# Patient Record
Sex: Female | Born: 1995 | Race: White | Hispanic: No | Marital: Single | Smoking: Never smoker
Health system: Southern US, Community
[De-identification: ages and names within clinical notes are randomized; demographics above are authoritative.]

## PROBLEM LIST (undated history)

## (undated) DIAGNOSIS — G919 Hydrocephalus, unspecified: Secondary | ICD-10-CM

## (undated) DIAGNOSIS — E236 Other disorders of pituitary gland: Secondary | ICD-10-CM

## (undated) DIAGNOSIS — D352 Benign neoplasm of pituitary gland: Secondary | ICD-10-CM

## (undated) DIAGNOSIS — Q031 Atresia of foramina of Magendie and Luschka: Secondary | ICD-10-CM

## (undated) DIAGNOSIS — Q142 Congenital malformation of optic disc: Secondary | ICD-10-CM

## (undated) DIAGNOSIS — F329 Major depressive disorder, single episode, unspecified: Secondary | ICD-10-CM

## (undated) DIAGNOSIS — F32A Depression, unspecified: Secondary | ICD-10-CM

## (undated) HISTORY — DX: Atresia of foramina of Magendie and Luschka: Q03.1

## (undated) HISTORY — PX: VENTRICULOPERITONEAL SHUNT: SHX204

## (undated) HISTORY — DX: Congenital malformation of optic disc: Q14.2

## (undated) HISTORY — DX: Other disorders of pituitary gland: E23.6

## (undated) HISTORY — DX: Benign neoplasm of pituitary gland: D35.2

---
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
## 2018-08-05 ENCOUNTER — Emergency Department (HOSPITAL_COMMUNITY): Payer: Self-pay

## 2018-08-05 ENCOUNTER — Encounter (HOSPITAL_COMMUNITY): Payer: Self-pay

## 2018-08-05 ENCOUNTER — Other Ambulatory Visit: Payer: Self-pay

## 2018-08-05 ENCOUNTER — Emergency Department (HOSPITAL_COMMUNITY)
Admission: EM | Admit: 2018-08-05 | Discharge: 2018-08-05 | Disposition: A | Discharge status: Home / Self Care | Payer: Self-pay | Attending: Emergency Medicine | Admitting: Emergency Medicine

## 2018-08-05 DIAGNOSIS — R51 Headache: Secondary | ICD-10-CM | POA: Insufficient documentation

## 2018-08-05 DIAGNOSIS — R519 Headache, unspecified: Secondary | ICD-10-CM

## 2018-08-05 DIAGNOSIS — Z79899 Other long term (current) drug therapy: Secondary | ICD-10-CM | POA: Insufficient documentation

## 2018-08-05 LAB — I-STAT BETA HCG BLOOD, ED (MC, WL, AP ONLY): I-stat hCG, quantitative: <5 m[IU]/mL (ref <5)

## 2018-08-05 MED ORDER — PROCHLORPERAZINE EDISYLATE 10 MG/2ML IJ SOLN
10.0000 mg | Freq: Once | INTRAMUSCULAR | Status: AC
  Administered 2018-08-05: 10 mg via INTRAVENOUS

## 2018-08-05 MED ORDER — MAGNESIUM SULFATE 2 GM/50ML IV SOLN
2.0000 g | Freq: Once | INTRAVENOUS | Status: AC
  Administered 2018-08-05: 2 g via INTRAVENOUS
  Filled 2018-08-05: qty 50

## 2018-08-05 MED ORDER — PROCHLORPERAZINE EDISYLATE 10 MG/2ML IJ SOLN
10.0000 mg | Freq: Once | INTRAMUSCULAR | Status: DC
  Administered 2018-08-05: 10 mg via INTRAVENOUS

## 2018-08-05 MED ORDER — IOHEXOL 350 MG/ML SOLN
50.0000 mL | Freq: Once | INTRAVENOUS | Status: AC | PRN
  Administered 2018-08-05: 50 mL via INTRAVENOUS

## 2018-08-05 MED ORDER — PROCHLORPERAZINE EDISYLATE 10 MG/2ML IJ SOLN
10.0000 mg | Freq: Once | INTRAMUSCULAR | Status: DC
  Filled 2018-08-05: qty 2

## 2018-08-05 MED ORDER — DIPHENHYDRAMINE HCL 50 MG/ML IJ SOLN: 25.0000 mg | Freq: Once | INTRAMUSCULAR | Status: DC

## 2018-08-05 MED ORDER — DIPHENHYDRAMINE HCL 50 MG/ML IJ SOLN
25.0000 mg | Freq: Once | INTRAMUSCULAR | Status: DC
  Filled 2018-08-05: qty 1

## 2018-08-05 MED ORDER — MORPHINE SULFATE (PF) 4 MG/ML IV SOLN
4.0000 mg | Freq: Once | INTRAVENOUS | Status: AC
  Administered 2018-08-05: 4 mg via INTRAVENOUS
  Filled 2018-08-05: qty 1

## 2018-08-05 MED ORDER — DIPHENHYDRAMINE HCL 50 MG/ML IJ SOLN
25.0000 mg | Freq: Once | INTRAMUSCULAR | Status: AC
  Administered 2018-08-05: 25 mg via INTRAVENOUS

## 2018-08-05 MED ORDER — SODIUM CHLORIDE 0.9 % IV BOLUS
1000.0000 mL | Freq: Once | INTRAVENOUS | Status: AC
  Administered 2018-08-05: 16:00:00 1000 mL via INTRAVENOUS

## 2018-08-05 MED ORDER — ONDANSETRON HCL 4 MG/2ML IJ SOLN
4.0000 mg | Freq: Once | INTRAMUSCULAR | Status: AC
  Administered 2018-08-05: 15:00:00 4 mg via INTRAVENOUS
  Filled 2018-08-05: qty 2

## 2018-08-05 NOTE — ED Notes (Signed)
Patient verbalizes understanding of discharge instructions . Opportunity for questions and answers were provided . Armband removed by staff ,Pt discharged from ED. W/C  offered at D/C  and Declined W/C at D/C and was escorted to lobby by RN.  

## 2018-08-05 NOTE — ED Triage Notes (Signed)
Onset 11am headache across forehead radiating over top of head and to back of head.  Took ibuprofen x 3, vomited 20 minutes later, x 3.   Pt started Prednisone for widespread rash.  Unknown origin.  Denied fever.  08-06-18 last dose of prednisone.  Pt has h/o VP shunt.

## 2018-08-05 NOTE — ED Notes (Signed)
Patient transported to CT 

## 2018-08-05 NOTE — ED Provider Notes (Signed)
[place] EMERGENCY DEPARTMENT Provider Note   CSN: [id number] Arrival date & time: 08/05/18  1231    History   Chief Complaint Chief Complaint  Patient presents with   Headache    HPI Debra Schultz is a 23 y.o. female.     23 yo F with a cc of headache.  This feels like her prior shunt malfunction.  Last revision was done in 2009.  Going on for a few hours.  Having persistent vomiting.  Denies weakness or numbness to the extremities denies neck pain denies fevers or chills. Having dizziness as well.    The history is provided by the patient.  Headache  Pain location:  Generalized Quality:  Dull Radiates to:  Does not radiate Severity currently:  9/10 Severity at highest:  9/10 Onset quality:  Gradual Duration:  2 hours Timing:  Constant Progression:  Worsening Chronicity:  Recurrent Similar to prior headaches: yes   Relieved by:  Nothing Worsened by:  Nothing Ineffective treatments:  None tried Associated symptoms: nausea and vomiting   Associated symptoms: no congestion, no dizziness, no fever and no myalgias     Past Medical History:  Diagnosis Date   Depression    Hydrocephalus ([place])     There are no active problems to display for this patient.   Past Surgical History:  Procedure Laterality Date   VENTRICULOPERITONEAL SHUNT       OB History   No obstetric history on file.      Home Medications    Prior to Admission medications   Medication Sig Start Date End Date Taking? Authorizing Provider  cephALEXin (KEFLEX) 500 MG capsule Take 1 capsule (500 mg total) by mouth 4 (four) times daily. 09/09/13   Noe Gens, PA-C  citalopram (CELEXA) 20 MG tablet Take 20 mg by mouth daily.    [provider]  ibuprofen (ADVIL,MOTRIN) 200 MG tablet Take 800 mg by mouth every 6 (six) hours as needed.    [provider]  loratadine (CLARITIN) 10 MG tablet Take 1 tablet (10 mg total) by mouth daily. 08/22/13   Isaac Bliss, MD  terbinafine (LAMISIL) 250 MG tablet Take 1 tablet (250 mg total) by mouth daily. For 2-3 weeks. 09/09/13   Noe Gens, PA-C    Family History History reviewed. No pertinent family history.  Social History Social History   Tobacco Use   Smoking status: Never Smoker  Substance Use Topics   Alcohol use: Yes    Comment: rarely    Drug use: Not on file     Allergies   Patient has no known allergies.   Review of Systems Review of Systems  Constitutional: Negative for chills and fever.  HENT: Negative for congestion and rhinorrhea.   Eyes: Negative for redness and visual disturbance.  Respiratory: Negative for shortness of breath and wheezing.   Cardiovascular: Negative for chest pain and palpitations.  Gastrointestinal: Positive for nausea and vomiting.  Genitourinary: Negative for dysuria and urgency.  Musculoskeletal: Negative for arthralgias and myalgias.  Skin: Negative for pallor and wound.  Neurological: Positive for headaches. Negative for dizziness.     Physical Exam Updated Vital Signs BP 107/73    Pulse 90    Temp 97.7 F (36.5 C) (Oral)    Resp 20    SpO2 100%   Physical Exam Vitals signs and nursing note reviewed.  Constitutional:      General: She is not in acute distress.    Appearance:  She is well-developed. She is not diaphoretic.  HENT:     Head: Normocephalic and atraumatic.  Eyes:     Pupils: Pupils are equal, round, and reactive to light.  Neck:     Musculoskeletal: Normal range of motion and neck supple.  Cardiovascular:     Rate and Rhythm: Normal rate and regular rhythm.     Heart sounds: No murmur. No friction rub. No gallop.   Pulmonary:     Effort: Pulmonary effort is normal.     Breath sounds: No wheezing or rales.  Abdominal:     General: There is no distension.     Palpations: Abdomen is soft.     Tenderness: There is no abdominal tenderness.  Musculoskeletal:        General: No tenderness.  Skin:    General:  Skin is warm and dry.  Neurological:     Mental Status: She is alert and oriented to person, place, and time.     Cranial Nerves: Cranial nerves are intact.     Motor: Motor function is intact.     Coordination: Coordination is intact.     Gait: Gait is intact.  Psychiatric:        Behavior: Behavior normal.      ED Treatments / Results  Labs (all labs ordered are listed, but only abnormal results are displayed) Labs Reviewed  I-STAT BETA HCG BLOOD, ED ([facility], [facility], [facility] ONLY)    EKG None  Radiology Ct Angio Head W Or Wo Contrast  Result Date: 08/05/2018 CLINICAL DATA:  Headache. Possible cerebral aneurysm on head CT. History of VP shunt. EXAM: CT ANGIOGRAPHY HEAD AND NECK TECHNIQUE: Multidetector CT imaging of the head and neck was performed using the standard protocol during bolus administration of intravenous contrast. Multiplanar CT image reconstructions and MIPs were obtained to evaluate the vascular anatomy. Carotid stenosis measurements (when applicable) are obtained utilizing NASCET criteria, using the distal internal carotid diameter as the denominator. CONTRAST:  89mL OMNIPAQUE IOHEXOL 350 MG/ML SOLN COMPARISON:  Noncontrast head CT 08/05/2018. No prior angiographic imaging. FINDINGS: CTA NECK FINDINGS Aortic arch: Normal variant aortic arch branching pattern with common origin of the brachiocephalic and left common carotid arteries. Widely patent arch vessel origins. Right carotid system: Patent and smooth without evidence of stenosis or dissection. Left carotid system: Patent and smooth without evidence of stenosis or dissection. Vertebral arteries: Patent and smooth without evidence of stenosis or dissection. Mildly dominant left vertebral artery. Skeleton: No acute osseous abnormality or suspicious osseous lesion. Other neck: No mass or enlarged lymph nodes. VP shunt catheter coursing through the left neck. Calcification adjacent to the catheter may reflect an old catheter tract.  Upper chest: Clear lung apices. Review of the MIP images confirms the above findings CTA HEAD FINDINGS Anterior circulation: The internal carotid arteries are widely patent from skull base to carotid termini. ACAs and MCAs are patent without evidence of proximal branch occlusion or significant proximal stenosis although MCA branch vessel evaluation is mildly limited by venous contamination. No aneurysm is identified. Posterior circulation: Intracranial vertebral arteries are widely patent to the basilar. Patent left PICA and bilateral SCAs are present. AICAs and a right PICA are not well seen. The basilar artery is widely patent. Posterior communicating arteries are diminutive or absent. PCAs are patent without evidence of significant proximal stenosis. No aneurysm is identified. Venous sinuses: As permitted by contrast timing, patent. Anatomic variants: None. Delayed phase: The abnormality described on earlier noncontrast CT represents a 9-10  mm suprasellar mass which appears to extend upwards from the sella and enhances similarly to pituitary. Review of the MIP images confirms the above findings IMPRESSION: 1. Patent intracranial and cervical arterial circulation without major branch occlusion, significant stenosis, or aneurysm. 2. 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 Consider endocrine function tests and correlate for history of pituitary hypersecretion. Follow-up pituitary protocol brain MRI (without and with contrast) can also be considered. Electronically Signed   By: Logan Bores M.D.   On: 08/05/2018 16:02   Ct Head Wo Contrast  Result Date: 08/05/2018 CLINICAL DATA:  23 year old female with acute headache today. History of hydrocephalus and VP shunt. EXAM: CT HEAD WITHOUT CONTRAST TECHNIQUE: Contiguous axial images were obtained from the base of the skull through the vertex without intravenous contrast. COMPARISON:  08/22/2007 CT FINDINGS: Brain: No evidence of acute infarction,  hemorrhage, extra-axial collection or mass effect. A LEFT occipital ventriculostomy catheter is again noted with tip in the MEDIAL aspect of the LEFT LATERAL ventricle. Prominence of the LATERAL ventricle occipital horns is unchanged from 2009. The remainder of the ventricular system is not dilated. Vascular: A 7 mm rounded prominence in the region of the anterior communicating artery noted and aneurysm is not excluded. Skull: No acute abnormality Sinuses/Orbits: No acute abnormality Other: None. IMPRESSION: 1. 7 mm rounded prominence in the region of the anterior communicating artery-aneurysm not excluded. Brain MRI/MRA with contrast recommended. 2. No evidence of acute abnormality. 3. LEFT occipital ventriculostomy catheter. Unchanged prominence of the LATERAL ventricular occipital horns. Electronically Signed   By: Margarette Canada M.D.   On: 08/05/2018 14:02   Ct Angio Neck W And/or Wo Contrast  Result Date: 08/05/2018 CLINICAL DATA:  Headache. Possible cerebral aneurysm on head CT. History of VP shunt. EXAM: CT ANGIOGRAPHY HEAD AND NECK TECHNIQUE: Multidetector CT imaging of the head and neck was performed using the standard protocol during bolus administration of intravenous contrast. Multiplanar CT image reconstructions and MIPs were obtained to evaluate the vascular anatomy. Carotid stenosis measurements (when applicable) are obtained utilizing NASCET criteria, using the distal internal carotid diameter as the denominator. CONTRAST:  70mL OMNIPAQUE IOHEXOL 350 MG/ML SOLN COMPARISON:  Noncontrast head CT 08/05/2018. No prior angiographic imaging. FINDINGS: CTA NECK FINDINGS Aortic arch: Normal variant aortic arch branching pattern with common origin of the brachiocephalic and left common carotid arteries. Widely patent arch vessel origins. Right carotid system: Patent and smooth without evidence of stenosis or dissection. Left carotid system: Patent and smooth without evidence of stenosis or dissection.  Vertebral arteries: Patent and smooth without evidence of stenosis or dissection. Mildly dominant left vertebral artery. Skeleton: No acute osseous abnormality or suspicious osseous lesion. Other neck: No mass or enlarged lymph nodes. VP shunt catheter coursing through the left neck. Calcification adjacent to the catheter may reflect an old catheter tract. Upper chest: Clear lung apices. Review of the MIP images confirms the above findings CTA HEAD FINDINGS Anterior circulation: The internal carotid arteries are widely patent from skull base to carotid termini. ACAs and MCAs are patent without evidence of proximal branch occlusion or significant proximal stenosis although MCA branch vessel evaluation is mildly limited by venous contamination. No aneurysm is identified. Posterior circulation: Intracranial vertebral arteries are widely patent to the basilar. Patent left PICA and bilateral SCAs are present. AICAs and a right PICA are not well seen. The basilar artery is widely patent. Posterior communicating arteries are diminutive or absent. PCAs are patent without evidence of significant proximal stenosis. No aneurysm  is identified. Venous sinuses: As permitted by contrast timing, patent. Anatomic variants: None. Delayed phase: The abnormality described on earlier noncontrast CT represents a 9-10 mm suprasellar mass which appears to extend upwards from the sella and enhances similarly to pituitary. Review of the MIP images confirms the above findings IMPRESSION: 1. Patent intracranial and cervical arterial circulation without major branch occlusion, significant stenosis, or aneurysm. 2. 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 Consider endocrine function tests and correlate for history of pituitary hypersecretion. Follow-up pituitary protocol brain MRI (without and with contrast) can also be considered. Electronically Signed   By: Logan Bores M.D.   On: 08/05/2018 16:02     Procedures Procedures (including critical care time) Procedure note: Ultrasound Guided Peripheral IV Ultrasound guided peripheral 1.88 inch angiocath IV placement performed by me. Indications: Nursing unable to place IV. Details: The antecubital fossa and upper arm were evaluated with a multifrequency linear probe. Patent brachial veins were noted. 1 attempt was made to cannulate a vein under realtime US guidance with successful cannulation of the vein and catheter placement. There is return of non-pulsatile dark red blood. The patient tolerated the procedure well without complications. Images archived electronically.  CPT codes: [id number] and [phone]  Medications Ordered in ED Medications  magnesium sulfate IVPB 2 g 50 mL (2 g Intravenous Incomplete 08/05/18 1558)  sodium chloride 0.9 % bolus 1,000 mL (0 mLs Intravenous Stopped 08/05/18 1632)  diphenhydrAMINE (BENADRYL) injection 25 mg (25 mg Intravenous Given 08/05/18 1323)  prochlorperazine (COMPAZINE) injection 10 mg (10 mg Intravenous Given 08/05/18 1324)  morphine 4 MG/ML injection 4 mg (4 mg Intravenous Given 08/05/18 1451)  ondansetron (ZOFRAN) injection 4 mg (4 mg Intravenous Given 08/05/18 1432)  iohexol (OMNIPAQUE) 350 MG/ML injection 50 mL (50 mLs Intravenous Contrast Given 08/05/18 1525)     Initial Impression / Assessment and Plan / ED Course  I have reviewed the triage vital signs and the nursing notes.  Pertinent labs & imaging results that were available during my care of the patient were reviewed by me and considered in my medical decision making (see chart for details).        23 yo F with a cc of acute headache vertigo and vomiting.  Going on for about 2 hours.  Hx of VP shunt and feels the same. Obtain CT head, give headache cocktail, fluids, shunt series.   Patient is a far is refusing the shunt series.  The CT of the head is similar to her prior that was done here in 09. In discussion with the patient after that time she  was transferred to [facility] and had a revision.  CT also had something that was concerning for possible aneurysm, will obtain a CT angiogram of the head and neck.  CTA of the head and neck pain is without obvious aneurysm, the patient does have possible suprasellar mass.  On reassessment the patient's pain is completely gone.  She is feeling much better.  I discussed the case with Dr. Vertell Limber, neurosurgery with her feeling completely better he felt that she was okay to be discharged.  When I discussed the conversation with her and the results of the CT angiogram she mentioned that they were thinking of going back to [facility] for further care for her VP shunt.  I have given her the information for the neurosurgeon that had seen her most recently.  4:46 PM:  I have discussed the diagnosis/risks/treatment options with the patient and believe the pt to be  eligible for discharge home to follow-up with PCP, . We also discussed returning to the ED immediately if new or worsening sx occur. We discussed the sx which are most concerning (e.g., sudden worsening pain, fever, inability to tolerate by mouth) that necessitate immediate return. Medications administered to the patient during their visit and any new prescriptions provided to the patient are listed below.  Medications given during this visit Medications  magnesium sulfate IVPB 2 g 50 mL (2 g Intravenous Incomplete 08/05/18 1558)  sodium chloride 0.9 % bolus 1,000 mL (0 mLs Intravenous Stopped 08/05/18 1632)  diphenhydrAMINE (BENADRYL) injection 25 mg (25 mg Intravenous Given 08/05/18 1323)  prochlorperazine (COMPAZINE) injection 10 mg (10 mg Intravenous Given 08/05/18 1324)  morphine 4 MG/ML injection 4 mg (4 mg Intravenous Given 08/05/18 1451)  ondansetron (ZOFRAN) injection 4 mg (4 mg Intravenous Given 08/05/18 1432)  iohexol (OMNIPAQUE) 350 MG/ML injection 50 mL (50 mLs Intravenous Contrast Given 08/05/18 1525)     The patient appears reasonably screen and/or  stabilized for discharge and I doubt any other medical condition or other [facility] requiring further screening, evaluation, or treatment in the ED at this time prior to discharge.   Final Clinical Impressions(s) / ED Diagnoses   Final diagnoses:  Bad headache    ED Discharge Orders    None       Deno Etienne, DO 08/05/18 1655

## 2018-08-05 NOTE — Discharge Instructions (Signed)
On CT scan there was concern for a small spot in the sellar space, this needs to be worked up further as an outpatient to identify what it is.  The initial recommendation was for MRI but if your shunt is not compatible and then please discuss with your family doctor and neurosurgeon of different options to try and further work this up.  Please return to the emergency department for worsening headache fever inability to eat or drink.

## 2018-09-14 ENCOUNTER — Encounter (HOSPITAL_COMMUNITY): Payer: Self-pay

## 2018-09-14 ENCOUNTER — Emergency Department (HOSPITAL_COMMUNITY)
Admission: EM | Admit: 2018-09-14 | Discharge: 2018-09-14 | Disposition: A | Discharge status: Home / Self Care | Payer: Medicaid Other | Attending: Emergency Medicine | Admitting: Emergency Medicine

## 2018-09-14 ENCOUNTER — Emergency Department (HOSPITAL_COMMUNITY): Payer: Medicaid Other

## 2018-09-14 ENCOUNTER — Other Ambulatory Visit: Payer: Self-pay

## 2018-09-14 DIAGNOSIS — Z79899 Other long term (current) drug therapy: Secondary | ICD-10-CM | POA: Insufficient documentation

## 2018-09-14 DIAGNOSIS — R000 Tachycardia, unspecified: Secondary | ICD-10-CM

## 2018-09-14 DIAGNOSIS — Z982 Presence of cerebrospinal fluid drainage device: Secondary | ICD-10-CM | POA: Insufficient documentation

## 2018-09-14 LAB — CBC
HCT: 48.3 % — ABNORMAL HIGH (ref 36.0–46.0)
Hemoglobin: 16.3 g/dL — ABNORMAL HIGH (ref 12.0–15.0)
MCH: 31.6 pg (ref 26.0–34.0)
MCHC: 33.7 g/dL (ref 30.0–36.0)
MCV: 93.6 fL (ref 80.0–100.0)
Platelets: 222 10*3/uL (ref 150–400)
RBC: 5.16 MIL/uL — ABNORMAL HIGH (ref 3.87–5.11)
RDW: 13.2 % (ref 11.5–15.5)
WBC: 8.3 10*3/uL (ref 4.0–10.5)
nRBC: 0 % (ref 0.0–0.2)

## 2018-09-14 LAB — BASIC METABOLIC PANEL WITH GFR
Anion gap: 8 (ref 5–15)
BUN: 11 mg/dL (ref 6–20)
CO2: 27 mmol/L (ref 22–32)
Calcium: 9.2 mg/dL (ref 8.9–10.3)
Chloride: 103 mmol/L (ref 98–111)
Creatinine, Ser: 1.15 mg/dL — ABNORMAL HIGH (ref 0.44–1.00)
GFR calc Af Amer: >60 mL/min
GFR calc non Af Amer: >60 mL/min
Glucose, Bld: 115 mg/dL — ABNORMAL HIGH (ref 70–99)
Potassium: 3.9 mmol/L (ref 3.5–5.1)
Sodium: 138 mmol/L (ref 135–145)

## 2018-09-14 LAB — MAGNESIUM: Magnesium: 2.3 mg/dL (ref 1.7–2.4)

## 2018-09-14 LAB — I-STAT BETA HCG BLOOD, ED (MC, WL, AP ONLY): I-stat hCG, quantitative: <5 m[IU]/mL (ref <5)

## 2018-09-14 MED ORDER — SODIUM CHLORIDE 0.9 % IV BOLUS
1000.0000 mL | Freq: Once | INTRAVENOUS | Status: AC
  Administered 2018-09-14: 21:00:00 1000 mL via INTRAVENOUS

## 2018-09-14 MED ORDER — METOPROLOL SUCCINATE ER 25 MG PO TB24: 25.0000 mg | ORAL_TABLET | Freq: Every day | ORAL | 0 refills | Status: DC

## 2018-09-14 NOTE — ED Provider Notes (Signed)
[place] EMERGENCY DEPARTMENT Provider Note   CSN: [id number] Arrival date & time: 09/14/18  1847    History   Chief Complaint Chief Complaint  Patient presents with  . Tachycardia    HPI Debra Schultz is a 23 y.o. female.     HPI Patient presents with tachycardia.  History of VP shunt.  Was seen at [facility] today and had elevated heart rate.  However per the patient has been elevated for most of the last month.  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  Sometimes had some mild shortness of breath but not really short of breath this time.  No swelling in her legs.  No fevers.  No cough.  No abdominal pain.  No chest pain.  No weight loss.  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  Also history of VP shunt.  Patient has had somewhat decreased oral intake. Past Medical History:  Diagnosis Date  . Depression   . Hydrocephalus ([place])     There are no active problems to display for this patient.   Past Surgical History:  Procedure Laterality Date  . VENTRICULOPERITONEAL SHUNT       OB History   No obstetric history on file.      Home Medications    Prior to Admission medications   Medication Sig Start Date End Date Taking? Authorizing Provider  cephALEXin (KEFLEX) 500 MG capsule Take 1 capsule (500 mg total) by mouth 4 (four) times daily. 09/09/13   Noe Gens, PA-C  citalopram (CELEXA) 20 MG tablet Take 20 mg by mouth daily.    [provider]  ibuprofen (ADVIL,MOTRIN) 200 MG tablet Take 800 mg by mouth every 6 (six) hours as needed.    [provider]  loratadine (CLARITIN) 10 MG tablet Take 1 tablet (10 mg total) by mouth daily. 08/22/13   Isaac Bliss, MD  metoprolol succinate (TOPROL-XL) 25 MG 24 hr tablet Take 1 tablet (25 mg total) by mouth daily. 09/14/18   Davonna Belling, MD  terbinafine (LAMISIL) 250 MG tablet Take 1 tablet (250 mg total) by mouth daily. For 2-3  weeks. 09/09/13   Noe Gens, PA-C    Family History History reviewed. No pertinent family history.  Social History Social History   Tobacco Use  . Smoking status: Never Smoker  Substance Use Topics  . Alcohol use: Yes    Comment: rarely   . Drug use: Not on file     Allergies   Patient has no known allergies.   Review of Systems Review of Systems  Constitutional: Negative for appetite change.  HENT: Negative for congestion.   Respiratory: Positive for shortness of breath. Negative for wheezing and stridor.   Cardiovascular: Positive for palpitations. Negative for chest pain.  Gastrointestinal: Negative for abdominal pain.  Genitourinary: Negative for flank pain.  Musculoskeletal: Negative for back pain.  Skin: Negative for rash.  Neurological: Negative for weakness.     Physical Exam Updated Vital Signs BP 111/68   Pulse (!) 110   Temp 99.2 F (37.3 C) (Oral)   Resp 17   Ht 5\' 3"  (1.6 m)   Wt 68 kg   SpO2 100%   BMI 26.57 kg/m   Physical Exam Vitals signs and nursing note reviewed.  HENT:     Head: Normocephalic.  Eyes:     Extraocular Movements: Extraocular movements intact.  Cardiovascular:     Rate and Rhythm:  Tachycardia present.  Pulmonary:     Breath sounds: No wheezing, rhonchi or rales.  Abdominal:     General: There is no distension.  Musculoskeletal:     Right lower leg: No edema.     Left lower leg: No edema.  Skin:    General: Skin is warm.     Capillary Refill: Capillary refill takes less than 2 seconds.  Neurological:     Mental Status: She is alert. Mental status is at baseline.      ED Treatments / Results  Labs (all labs ordered are listed, but only abnormal results are displayed) Labs Reviewed  BASIC METABOLIC PANEL - Abnormal; Notable for the following components:      Result Value   Glucose, Bld 115 (*)    Creatinine, Ser 1.15 (*)    All other components within normal limits  CBC - Abnormal; Notable for the  following components:   RBC 5.16 (*)    Hemoglobin 16.3 (*)    HCT 48.3 (*)    All other components within normal limits  MAGNESIUM  I-STAT BETA HCG BLOOD, ED ([facility], [facility], [facility] ONLY)    EKG EKG Interpretation  Date/Time:  Thursday Sep 14 2018 19:48:57 EDT Ventricular Rate:  137 PR Interval:    QRS Duration: 80 QT Interval:  295 QTC Calculation: 446 R Axis:   75 Text Interpretation:  Sinus tachycardia Borderline T abnormalities, anterior leads No old tracing to compare Confirmed by Davonna Belling [phone]) on 09/14/2018 8:01:09 PM   Radiology Dg Chest Portable 1 View  Result Date: 09/14/2018 CLINICAL DATA:  Shortness of breath EXAM: PORTABLE CHEST 1 VIEW COMPARISON:  09/21/2007 FINDINGS: The heart size and mediastinal contours are within normal limits. Both lungs are clear. The visualized skeletal structures are unremarkable. VP shunt tube overlies the left chest. IMPRESSION: No active disease. Electronically Signed   By: Nelson Chimes M.D.   On: 09/14/2018 20:34    Procedures Procedures (including critical care time)  Medications Ordered in ED Medications  sodium chloride 0.9 % bolus 1,000 mL (0 mLs Intravenous Stopped 09/14/18 2211)     Initial Impression / Assessment and Plan / ED Course  I have reviewed the triage vital signs and the nursing notes.  Pertinent labs & imaging results that were available during my care of the patient were reviewed by me and considered in my medical decision making (see chart for details).        Patient with a sinus tachycardia.  Patient family member states he does go up when she goes to the doctor because she gets a little nervous however reportedly is been running at 120 at home also.  Lab work reassuring.  Although the hemoglobin is a little high.  May have some dehydration component creatinine minimally elevated.  Fluid bolus given a had some decrease in the heart rate.  However I think this is likely not the only component.  Will use  low-dose metoprolol.  Will have cardiology follow-up.  X-ray reassuring.  Pulmonary embolism felt less likely.  Discharge home.  TSH done recently was normal.  Final Clinical Impressions(s) / ED Diagnoses   Final diagnoses:  Sinus tachycardia    ED Discharge Orders         Ordered    metoprolol succinate (TOPROL-XL) 25 MG 24 hr tablet  Daily     09/14/18 2201           Davonna Belling, MD 09/14/18 2316

## 2018-09-14 NOTE — ED Triage Notes (Signed)
Pt brought in by mother. Hx of brain tumor and VP shunt. Per mother she was at [place] earlier today and was discharged but has had persistent tachycardia between 120-144. Pt alert and cooperative on arrival.

## 2018-09-28 ENCOUNTER — Telehealth: Payer: Self-pay | Admitting: Interventional Cardiology

## 2018-09-28 NOTE — Telephone Encounter (Signed)
New Message    Pt is wanting to be seen in the office for her New Patient appt  She also is wondering if she can get a refill of the Metoprolol, enough to get her through until her appt     Please call back

## 2018-09-29 NOTE — Telephone Encounter (Signed)
Called and spoke to patient. She states that she would prefer in office visit if possible but would be willing to do a virtual visit. Made patient aware that I will re-assess covid situation with office protocol for bring patients in a little closer to her appointment and let her know.   Patient is requesting a refill be sent to her pharmacy to last her until her appointment. Patient has never been seen by cardiology. Patient will reach out to PCP for refill.

## 2018-11-08 ENCOUNTER — Encounter: Payer: Self-pay | Admitting: Interventional Cardiology

## 2018-11-08 ENCOUNTER — Ambulatory Visit (INDEPENDENT_AMBULATORY_CARE_PROVIDER_SITE_OTHER): Payer: Self-pay | Admitting: Interventional Cardiology

## 2018-11-08 ENCOUNTER — Other Ambulatory Visit: Payer: Self-pay

## 2018-11-08 VITALS — BP 108/82 | HR 94 | Ht 63.0 in | Wt 154.8 lb

## 2018-11-08 DIAGNOSIS — R000 Tachycardia, unspecified: Secondary | ICD-10-CM

## 2018-11-08 DIAGNOSIS — R002 Palpitations: Secondary | ICD-10-CM

## 2018-11-08 NOTE — Patient Instructions (Signed)
Medication Instructions:  No changes If you need a refill on your cardiac medications before your next appointment, please call your pharmacy.   Lab work: none If you have labs (blood work) drawn today and your tests are completely normal, you will receive your results only by: Marland Kitchen MyChart Message (if you have MyChart) OR . A paper copy in the mail If you have any lab test that is abnormal or we need to change your treatment, we will call you to review the results.  Testing/Procedures: Your physician has requested that you have an echocardiogram. Echocardiography is a painless test that uses sound waves to create images of your heart. It provides your doctor with information about the size and shape of your heart and how well your heart's chambers and valves are working. This procedure takes approximately one hour. There are no restrictions for this procedure.  Follow-Up: Your physician recommends that you schedule a follow-up appointment as needed with Debra Schultz.  Any Other Special Instructions Will Be Listed Below (If Applicable).

## 2018-11-08 NOTE — Progress Notes (Signed)
Cardiology Office Note   Date:  11/08/2018   ID:  Kajuana Shareef, Alferd Apa [birth year], MRN [id number]  PCP:  Marton Redwood, MD    No chief complaint on file.  palpitations  Wt Readings from Last 3 Encounters:  11/08/18 154 lb 12.8 oz (70.2 kg)  09/14/18 150 lb (68 kg)  11/14/13 125 lb 6.4 oz (56.9 kg) (54 %, Z= 0.11)*   * Growth percentiles are based on CDC (Girls, 2-20 Years) data.       History of Present Illness: Debra Schultz is a 23 y.o. female who is being seen today for the evaluation of tachycardia at the request of Marton Redwood, MD.  Medical history includes VP shunt  She has had palpitations on and off for a year.  Metoprolol was started and this has reduced the palpitations.  At the time, she had found out about a pituitary tumor, migraine, vomiting.  ECG in the ER showed sinus tach.  Denies : Chest pain. Dizziness. Leg edema. Nitroglycerin use. Orthopnea. Palpitations. Paroxysmal nocturnal dyspnea. Shortness of breath. Syncope.   Grandfather with pacer.    Overall, sx much less with metoprolol.  She only notices her HR when she is walking quickly.     Past Medical History:  Diagnosis Date  . 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 Congenital anomaly of optic disc   . 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 Depression   . Hydrocephalus ([place])   . Hydrocephalus ([place])   . Intraventricular hemorrhage of newborn   . Pituitary mass [facility])     Past Surgical History:  Procedure Laterality Date  . VENTRICULOPERITONEAL SHUNT       Current Outpatient Medications  Medication Sig Dispense Refill  . clomiPRAMINE (ANAFRANIL) 25 MG capsule Take 25 mg by mouth at bedtime.    . hydrOXYzine (ATARAX/VISTARIL) 25 MG tablet Take 25 mg by mouth 3 (three) times daily as needed.    Marland Kitchen ibuprofen (ADVIL,MOTRIN) 200 MG tablet Take 800 mg by mouth every 6 (six) hours as needed.    . metoprolol succinate (TOPROL-XL) 25 MG 24 hr tablet Take 1 tablet (25 mg total) by  mouth daily. 14 tablet 0   No current facility-administered medications for this visit.     Allergies:   Patient has no known allergies.    Social History:  The patient  reports that she has never smoked. She has never used smokeless tobacco. She reports current alcohol use. She reports that she does not use drugs.   Family History:  The patient's family history includes CAD in her maternal aunt, maternal grandfather, and maternal uncle; Diabetes in her maternal grandfather and maternal grandmother; Other in her father.    ROS:  Please see the history of present illness.   Otherwise, review of systems are positive for tachycardia.   All other systems are reviewed and negative.    PHYSICAL EXAM: VS:  BP 108/82   Pulse 94   Ht 5\' 3"  (1.6 m)   Wt 154 lb 12.8 oz (70.2 kg)   SpO2 98%   BMI 27.42 kg/m  , BMI Body mass index is 27.42 kg/m. GEN: Well nourished, well developed, in no acute distress  HEENT: normal  Neck: no JVD, carotid bruits, or masses Cardiac: RRR; no murmurs, rubs, or gallops,no edema  Respiratory:  clear to auscultation bilaterally, normal work of breathing GI: soft, nontender, nondistended, + BS MS: no deformity or atrophy  Skin: warm and dry, no rash Neuro:  Strength and sensation are intact Psych: euthymic mood, full affect   EKG:   The ekg ordered 09/14/2018 demonstrates sinus tachycardia   Recent Labs: 09/14/2018: BUN 11; Creatinine, Ser 1.15; Hemoglobin 16.3; Magnesium 2.3; Platelets 222; Potassium 3.9; Sodium 138   Lipid Panel No results found for: CHOL, TRIG, HDL, CHOLHDL, VLDL, LDLCALC, LDLDIRECT   Other studies Reviewed: Additional studies/ records that were reviewed today with results demonstrating: .   ASSESSMENT AND PLAN:  1. Tachycardia: Improved with metoprolol.  OK to continue.  Plan for echo to eval why she may have had inappropriate sinus tach.  She did have soe stress at that time due to diagnosis of her pituitary tumor as well.  2.  Increase exercise to target below.      Current medicines are reviewed at length with the patient today.  The patient concerns regarding her medicines were addressed.  The following changes have been made:  No change  Labs/ tests ordered today include:  No orders of the defined types were placed in this encounter.   Recommend 150 minutes/week of aerobic exercise Low fat, low carb, high fiber diet recommended  Disposition:   FU for echo.  If ok, then prn f/u.   Signed, Larae Grooms, MD  11/08/2018 2:10 PM    [facility] [address] [place], [place], [place]  [address] Phone: [phone]; Fax: [phone]

## 2018-11-13 ENCOUNTER — Ambulatory Visit (HOSPITAL_COMMUNITY): Payer: Self-pay | Attending: Cardiology

## 2018-11-13 ENCOUNTER — Other Ambulatory Visit: Payer: Self-pay

## 2018-11-13 DIAGNOSIS — R000 Tachycardia, unspecified: Secondary | ICD-10-CM | POA: Insufficient documentation

## 2020-06-18 ENCOUNTER — Emergency Department (HOSPITAL_COMMUNITY): Payer: Self-pay

## 2020-06-18 ENCOUNTER — Emergency Department (HOSPITAL_COMMUNITY)
Admission: EM | Admit: 2020-06-18 | Discharge: 2020-06-18 | Disposition: A | Discharge status: Home / Self Care | Payer: Self-pay | Attending: Emergency Medicine | Admitting: Emergency Medicine

## 2020-06-18 DIAGNOSIS — R569 Unspecified convulsions: Secondary | ICD-10-CM

## 2020-06-18 DIAGNOSIS — Z86018 Personal history of other benign neoplasm: Secondary | ICD-10-CM | POA: Insufficient documentation

## 2020-06-18 DIAGNOSIS — Z79899 Other long term (current) drug therapy: Secondary | ICD-10-CM | POA: Insufficient documentation

## 2020-06-18 LAB — COMPREHENSIVE METABOLIC PANEL
ALT: 12 U/L (ref 0–44)
AST: 20 U/L (ref 15–41)
Albumin: 3.6 g/dL (ref 3.5–5.0)
Alkaline Phosphatase: 50 U/L (ref 38–126)
Anion gap: 12 (ref 5–15)
BUN: 9 mg/dL (ref 6–20)
CO2: 22 mmol/L (ref 22–32)
Calcium: 8.9 mg/dL (ref 8.9–10.3)
Chloride: 104 mmol/L (ref 98–111)
Creatinine, Ser: 1.05 mg/dL — ABNORMAL HIGH (ref 0.44–1.00)
GFR, Estimated: >60 mL/min (ref >60)
Glucose, Bld: 99 mg/dL (ref 70–99)
Potassium: 4.1 mmol/L (ref 3.5–5.1)
Sodium: 138 mmol/L (ref 135–145)
Total Bilirubin: 0.8 mg/dL (ref 0.3–1.2)
Total Protein: 6.3 g/dL — ABNORMAL LOW (ref 6.5–8.1)

## 2020-06-18 LAB — CBC WITH DIFFERENTIAL/PLATELET
Abs Immature Granulocytes: 0.05 10*3/uL (ref 0.00–0.07)
Basophils Absolute: 0 10*3/uL (ref 0.0–0.1)
Basophils Relative: 0 %
Eosinophils Absolute: 0 10*3/uL (ref 0.0–0.5)
Eosinophils Relative: 0 %
HCT: 44.8 % (ref 36.0–46.0)
Hemoglobin: 14.9 g/dL (ref 12.0–15.0)
Immature Granulocytes: 0 %
Lymphocytes Relative: 6 %
Lymphs Abs: 0.7 10*3/uL (ref 0.7–4.0)
MCH: 31.6 pg (ref 26.0–34.0)
MCHC: 33.3 g/dL (ref 30.0–36.0)
MCV: 94.9 fL (ref 80.0–100.0)
Monocytes Absolute: 0.4 10*3/uL (ref 0.1–1.0)
Monocytes Relative: 3 %
Neutro Abs: 11.3 10*3/uL — ABNORMAL HIGH (ref 1.7–7.7)
Neutrophils Relative %: 91 %
Platelets: 228 10*3/uL (ref 150–400)
RBC: 4.72 MIL/uL (ref 3.87–5.11)
RDW: 12.6 % (ref 11.5–15.5)
WBC: 12.6 10*3/uL — ABNORMAL HIGH (ref 4.0–10.5)
nRBC: 0 % (ref 0.0–0.2)

## 2020-06-18 LAB — ETHANOL: Alcohol, Ethyl (B): <10 mg/dL (ref <10)

## 2020-06-18 LAB — URINALYSIS, ROUTINE W REFLEX MICROSCOPIC
Bilirubin Urine: NEGATIVE
Glucose, UA: NEGATIVE mg/dL
Hgb urine dipstick: NEGATIVE
Ketones, ur: NEGATIVE mg/dL
Nitrite: NEGATIVE
Protein, ur: NEGATIVE mg/dL
Specific Gravity, Urine: 1.008 (ref 1.005–1.030)
pH: 6 (ref 5.0–8.0)

## 2020-06-18 LAB — RAPID URINE DRUG SCREEN, HOSP PERFORMED
Amphetamines: NOT DETECTED
Barbiturates: NOT DETECTED
Benzodiazepines: NOT DETECTED
Cocaine: NOT DETECTED
Opiates: NOT DETECTED
Tetrahydrocannabinol: NOT DETECTED

## 2020-06-18 LAB — MAGNESIUM: Magnesium: 2.3 mg/dL (ref 1.7–2.4)

## 2020-06-18 LAB — TSH: TSH: 2.249 u[IU]/mL (ref 0.350–4.500)

## 2020-06-18 MED ORDER — LACTATED RINGERS IV SOLN: INTRAVENOUS | Status: DC

## 2020-06-18 NOTE — ED Triage Notes (Signed)
Per ems pts family states that they heard a thud in the bathroom and when they went to see what happened the bathroom door was vibrating/shaking vigorously ~ a few minutes. Pt has a pituitary tumor and a shunt placed. Pt was alert and oriented x2 with ems but is now alert and oriented x4. Pt doesn't have a seizure history. With ems pt hr is 160 and per family 140-160 is her baseline, patient has seen a cardiologist for it.

## 2020-06-18 NOTE — ED Notes (Signed)
Patient transported to CT 

## 2020-06-18 NOTE — Discharge Instructions (Addendum)
1.  Call your neurologist at [facility] to schedule follow-up as soon as possible.  You will need an outpatient EEG.  You can discuss the possibility of a repeat MRI with your neurologist. 2.  Decrease your imipramine dose to 100 mg daily.  Call your psychiatrist as soon as possible to discuss medication changes. 3.  No activities that could result in injury if you had a seizure.  Do not climb to high places, swim for drive. 4.  Return to the emergency department if you develop any concerning, new symptoms or a repeat seizure. 5.  No alcohol or other drugs of abuse.  Do not take any other over-the-counter medications.  Try to get regular sleep at least 8 hours a day.  Try to eat regularly scheduled meals and small, nutritious snacks.

## 2020-06-18 NOTE — ED Provider Notes (Signed)
[facility] EMERGENCY DEPARTMENT Provider Note   CSN: [id number] Arrival date & time: 06/18/20  1720     History Chief Complaint  Patient presents with  . Seizures    Debra Schultz is a 25 y.o. female.  HPI Patient has no history of seizure disorder.  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  She has been treated with antidepressants for much of her life.  She reports she is on imipramine 100 mg twice a day.  This was a dose increased from 100 mg daily about 3 months ago.  She also takes Abilify 25 mg daily.  This was a new medication as of 3 to 4 weeks ago.  She reports she sometimes takes hydroxyzine but has not taken any for a number of months.  Denies any drugs of abuse.  Patient lives at home and goes to college locally.  She reports she felt well today.  She is not having headaches or visual symptoms.  No recent nausea vomiting or diarrhea.  She went into the bathroom to take a shower.  She reports she really got no preceding symptoms.  She turned her head and was getting in the shower and that the last thing she remembers.  Her mother heard her hit the ground in the bathroom.  She went to check on her and could hear his repetitive thumping on the door that sounded like a seizure.  The door was locked.  Her husband was able to break the door.  She reports they found the patient actively seizing with blue lips.  She was able to turn her on her side and the seizure abated.  Once she was awake she returned to normal mental status.  Patient reports that she does not have any headache, visual changes, chest pain or any other symptoms after the seizure.  She reports she feels back to normal.    Past Medical History:  Diagnosis Date  . 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 Congenital anomaly of optic disc   . 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 Depression   . Hydrocephalus ([place])   . Hydrocephalus ([place])   .  Intraventricular hemorrhage of newborn   . Pituitary mass ([place])     There are no problems to display for this patient.   Past Surgical History:  Procedure Laterality Date  . VENTRICULOPERITONEAL SHUNT       OB History   No obstetric history on file.     Family History  Problem Relation Age of Onset  . Other Father        SMOKER  . Diabetes Maternal Grandmother   . CAD Maternal Grandfather   . Diabetes Maternal Grandfather   . CAD Maternal Aunt   . CAD Maternal Uncle     Social History   Tobacco Use  . Smoking status: Never Smoker  . Smokeless tobacco: Never Used  Substance Use Topics  . Alcohol use: Yes    Comment: rarely   . Drug use: Never    Home Medications Prior to Admission medications   Medication Sig Start Date End Date Taking? Authorizing Provider  ARIPiprazole (ABILIFY) 5 MG tablet Take 5 mg by mouth at bedtime.   Yes [provider]  clomiPRAMINE (ANAFRANIL) 50 MG capsule Take 100 mg by mouth 2 (two) times daily.   Yes [provider]  hydrOXYzine (ATARAX/VISTARIL) 25 MG tablet Take 25 mg by mouth as  needed for anxiety.   Yes [provider]  levonorgestrel (MIRENA, 52 MG,) 20 MCG/24HR IUD 1 each by Intrauterine route once. Mirena 20 mcg/24 hours (7 yrs) 52 mg intrauterine device.   Yes [provider]  metoprolol succinate (TOPROL-XL) 25 MG 24 hr tablet Take 1 tablet (25 mg total) by mouth daily. Patient not taking: No sig reported 09/14/18   Davonna Belling, MD    Allergies    Patient has no known allergies.  Review of Systems   Review of Systems 10 systems reviewed and negative except as per HPI Physical Exam Updated Vital Signs BP 94/68   Pulse (!) 129   Temp 98.7 F (37.1 C) (Oral)   Resp (!) 21   SpO2 99%   Physical Exam Constitutional:      Appearance: Normal appearance. She is well-developed and well-nourished.  HENT:     Head: Normocephalic and atraumatic.     Mouth/Throat:     Mouth:  Mucous membranes are moist.     Pharynx: Oropharynx is clear.  Eyes:     Extraocular Movements: Extraocular movements intact and EOM normal.     Pupils: Pupils are equal, round, and reactive to light.  Cardiovascular:     Rate and Rhythm: Normal rate and regular rhythm.     Pulses: Intact distal pulses.     Heart sounds: Normal heart sounds.  Pulmonary:     Effort: Pulmonary effort is normal.     Breath sounds: Normal breath sounds.  Abdominal:     General: Bowel sounds are normal. There is no distension.     Palpations: Abdomen is soft.     Tenderness: There is no abdominal tenderness.  Musculoskeletal:        General: No edema. Normal range of motion.     Cervical back: Neck supple.  Skin:    General: Skin is warm, dry and intact.  Neurological:     General: No focal deficit present.     Mental Status: She is alert and oriented to person, place, and time.     GCS: GCS eye subscore is 4. GCS verbal subscore is 5. GCS motor subscore is 6.     Cranial Nerves: No cranial nerve deficit.     Motor: No weakness.     Coordination: Coordination normal.     Deep Tendon Reflexes: Strength normal.  Psychiatric:        Mood and Affect: Mood and affect and mood normal.     ED Results / Procedures / Treatments   Labs (all labs ordered are listed, but only abnormal results are displayed) Labs Reviewed  COMPREHENSIVE METABOLIC PANEL - Abnormal; Notable for the following components:      Result Value   Creatinine, Ser 1.05 (*)    Total Protein 6.3 (*)    All other components within normal limits  CBC WITH DIFFERENTIAL/PLATELET - Abnormal; Notable for the following components:   WBC 12.6 (*)    Neutro Abs 11.3 (*)    All other components within normal limits  URINALYSIS, ROUTINE W REFLEX MICROSCOPIC - Abnormal; Notable for the following components:   Color, Urine STRAW (*)    Leukocytes,Ua SMALL (*)    Bacteria, UA RARE (*)    All other components within normal limits  ETHANOL   RAPID URINE DRUG SCREEN, HOSP PERFORMED  TSH  MAGNESIUM  I-STAT BETA HCG BLOOD, ED ([facility], [facility], [facility] ONLY)    EKG EKG Interpretation  Date/Time:  Wednesday June 18 2020  17:37:12 EST Ventricular Rate:  144 PR Interval:    QRS Duration: 76 QT Interval:  290 QTC Calculation: 449 R Axis:   113 Text Interpretation: Sinus or ectopic atrial tachycardia Right axis deviation Nonspecific T abnormalities, diffuse leads no sig change from previous Confirmed by Charlesetta Shanks [phone]) on 06/18/2020 5:43:20 PM   Radiology CT Head Wo Contrast  Result Date: 06/18/2020 CLINICAL DATA:  Seizure EXAM: CT HEAD WITHOUT CONTRAST TECHNIQUE: Contiguous axial images were obtained from the base of the skull through the vertex without intravenous contrast. COMPARISON:  CT brain 08/05/2018 FINDINGS: Brain: No acute territorial infarction or intracranial hemorrhage is visualized. 7 mm soft tissue density at the suprasellar region. Left posterior shunt catheter with tip terminating in the left lateral ventricle. Stable ventricle size with asymmetric enlargement of the posterior horns. No midline shift. Vascular: No hyperdense vessels.  No unexpected calcification Skull: Left posterior parietal burr hole Sinuses/Orbits: No acute finding. Other: None IMPRESSION: 1. No definite CT evidence for acute intracranial abnormality. 2. 7 mm soft tissue density at the suprasellar region without change, this may be pituitary in origin. See CT angiography 08/05/2018. 3. Stable ventricle size with left posterior shunt catheter in place. Electronically Signed   By: Donavan Foil M.D.   On: 06/18/2020 19:42    Procedures Procedures   Medications Ordered in ED Medications  lactated ringers infusion ( Intravenous New Bag/Given 06/18/20 2031)    ED Course  I have reviewed the triage vital signs and the nursing notes.  Pertinent labs & imaging results that were available during my care of the patient were reviewed by me and considered in  my medical decision making (see chart for details).    MDM Rules/Calculators/A&P                          Consult: Reviewed with neurology Dr. Theda Sers.  At this time, consideration given to decrease seizure threshold with imipramine.  Although notably he advises typically this is seen at doses at 300 mg or greater.  Advised patient could get EEG and MRI but also reasonable to do this on outpatient basis with follow-up.  Patient presents with a new onset seizure.  This was witnessed at home.  Patient has history of pituitary adenoma which is stable on CT.  She has VP shunt which has been longstanding.  Patient's mother reports that she gets routine evaluation at [facility].  She did have an MRI done July 2021 which showed no change in the adenoma.  Patient shows no signs of any recent illness.  She has not been experiencing headaches, confusion, imbalance.  She is asymptomatic after the event.  At this time plan will be to follow-up with her providers at [facility] and determine whether repeat MRI imaging is indicated.  Patient needs to get a follow-up EEG.  Seizure precaution advised.  Patient will taper her imipramine down to 100 mg a day.  Patient's mother does not want her on antidepressants any longer.  She suspects her daughter has been misdiagnosed with depression when she actually has a mild form of autism.  She reports her daughter has been treated with antidepressants for many years and they have never seemed helpful.  I advised is very important to follow-up with the patient's psychologist and psychiatrist regarding definitive management of her medications, but agree with decreasing the imipramine with close follow-up plan. Final Clinical Impression(s) / ED Diagnoses Final diagnoses:  New onset seizure ([place])  Rx / DC Orders ED Discharge Orders    None       Charlesetta Shanks, MD 06/18/20 2302

## 2020-06-18 NOTE — ED Notes (Signed)
Lined bed rails with blankets and a seizure pad.

## 2020-07-22 DIAGNOSIS — R87619 Unspecified abnormal cytological findings in specimens from cervix uteri: Secondary | ICD-10-CM | POA: Insufficient documentation

## 2021-03-11 IMAGING — CT CT HEAD W/O CM
4 series · 17 of 47 positions shown, 19 images · non-contrast
Comparison: CT brain 08/05/2018

CLINICAL DATA: Seizure

EXAM:
CT HEAD WITHOUT CONTRAST
TECHNIQUE: Contiguous axial images were obtained from the base of the skull
through the vertex without intravenous contrast.

[Series 3: head bone · axial · 0.40mm/px · z∈[-109,-55]mm · 4 of 77 slices shown]
[im 8/77  bone]
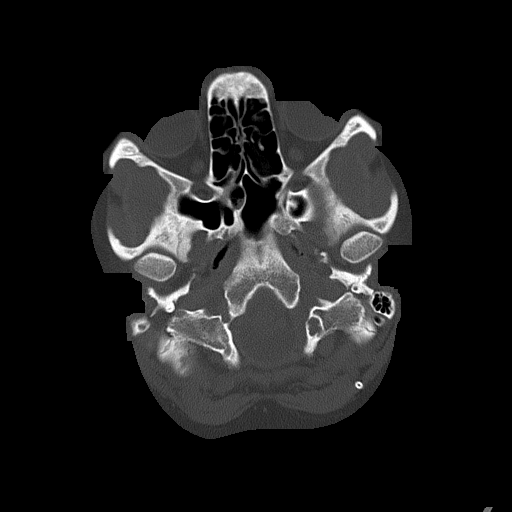
[im 16/77  bone]
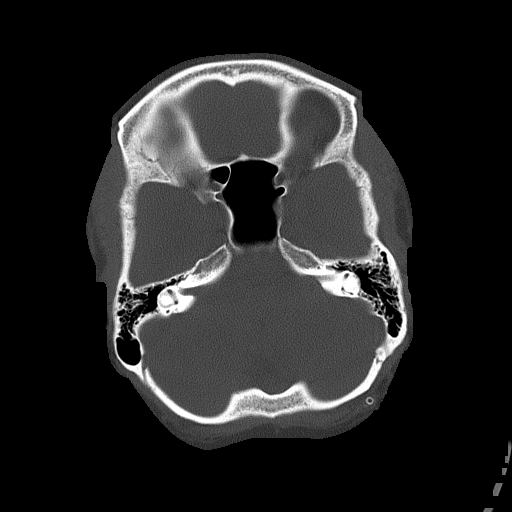
[im 23/77  bone]
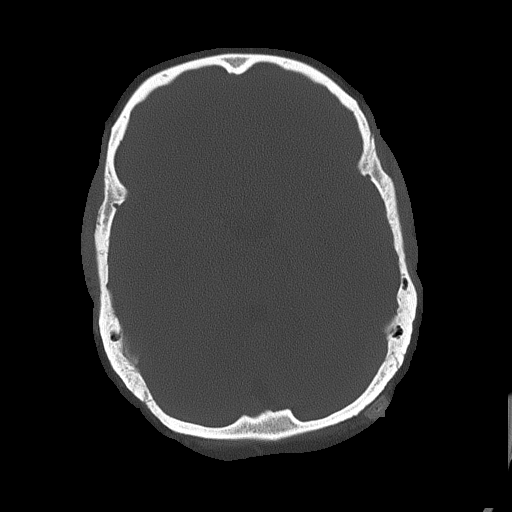
[im 35/77  bone]
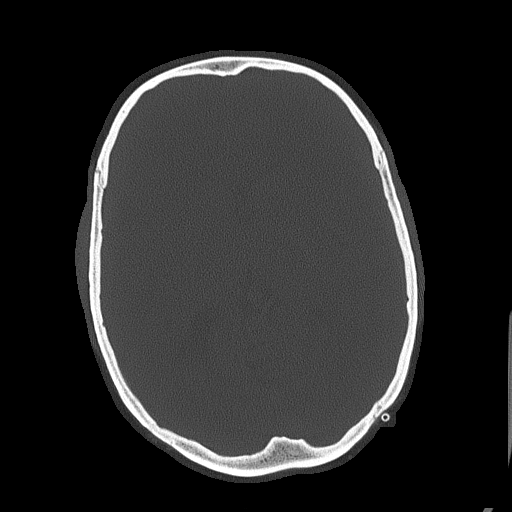

[Series 4: head without · axial · non-contrast · 0.40mm/px · z∈[-108,+7]mm · 7 of 31 slices shown, 9 images]
[im 4/31  brain]
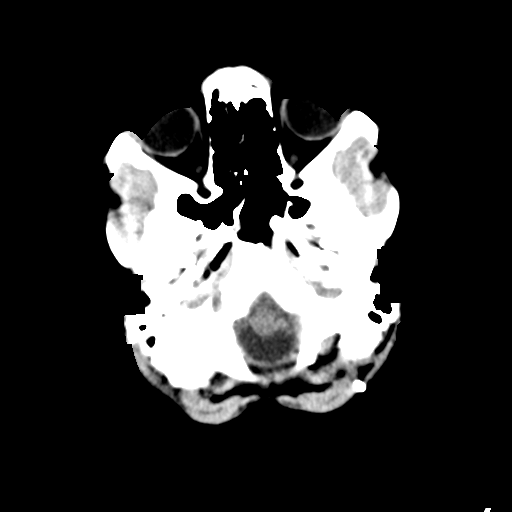
[im 4/31  bone]
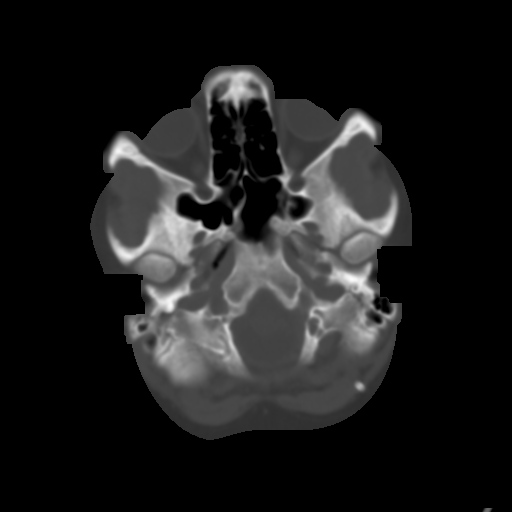
[im 8/31  brain]
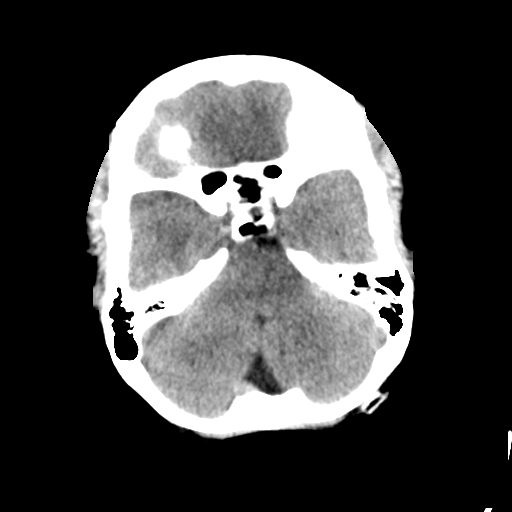
[im 12/31  brain]
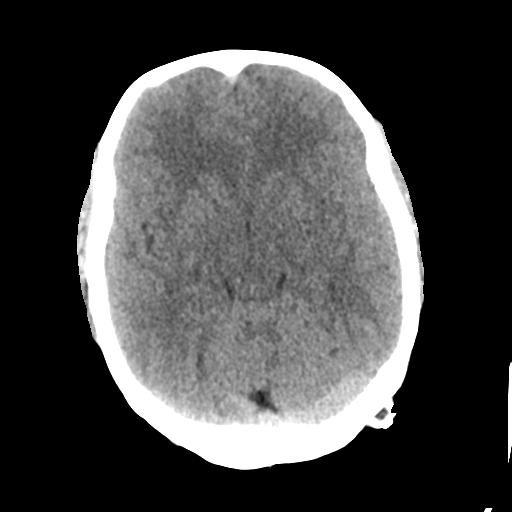
[im 16/31  brain]
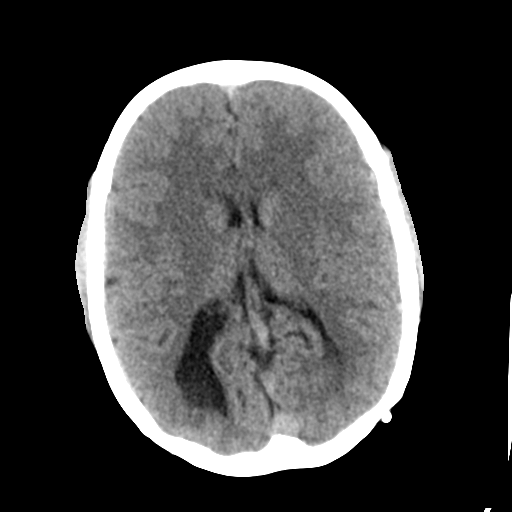
[im 19/31  brain]
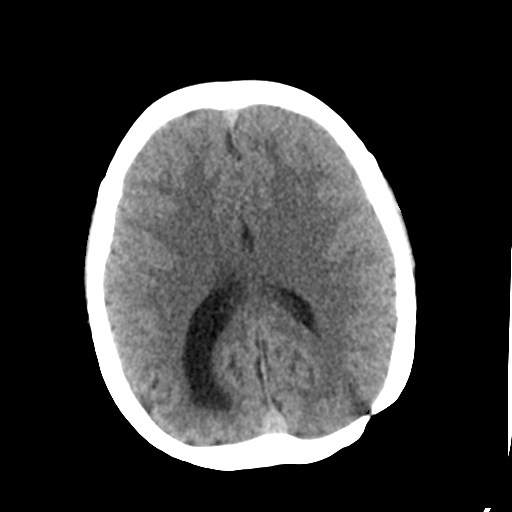
[im 19/31  bone]
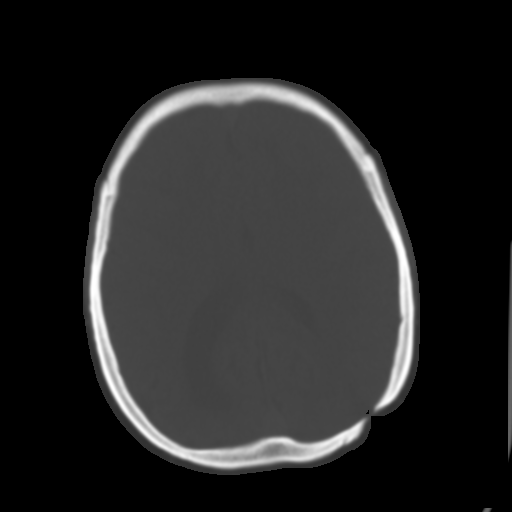
[im 23/31  brain]
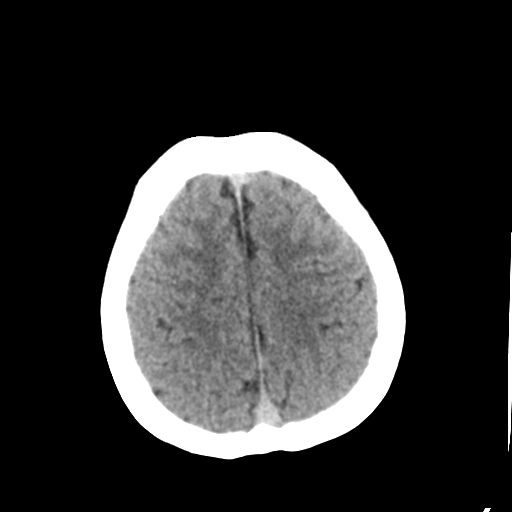
[im 27/31  brain]
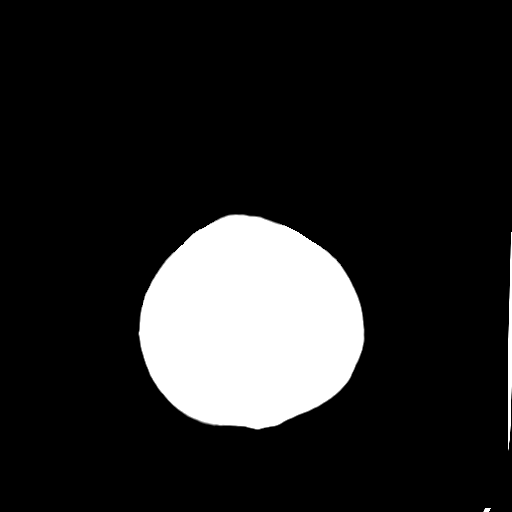

[Series 5: head without cor · coronal · non-contrast · 0.30mm/px · 3 of 65 slices shown]
[im 22/65  brain]
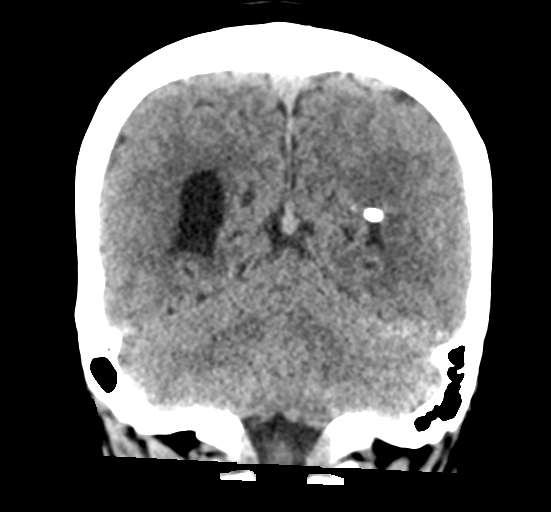
[im 29/65  brain]
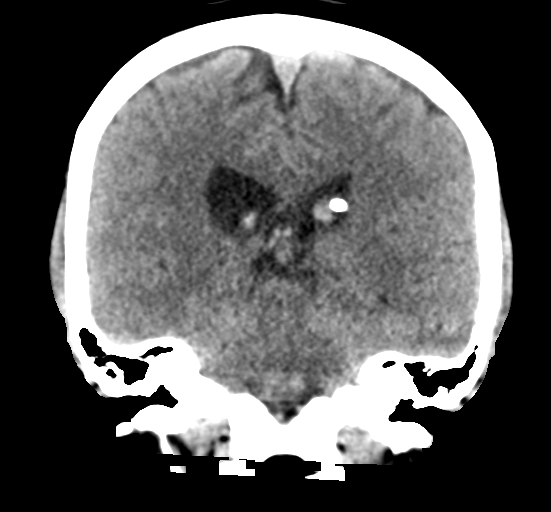
[im 36/65  brain]
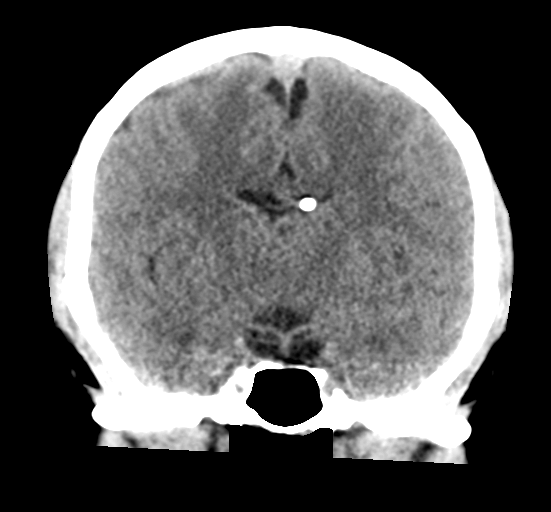

[Series 6: head without sag · sagittal · non-contrast · 0.33mm/px · 3 of 67 slices shown]
[im 23/67  brain]
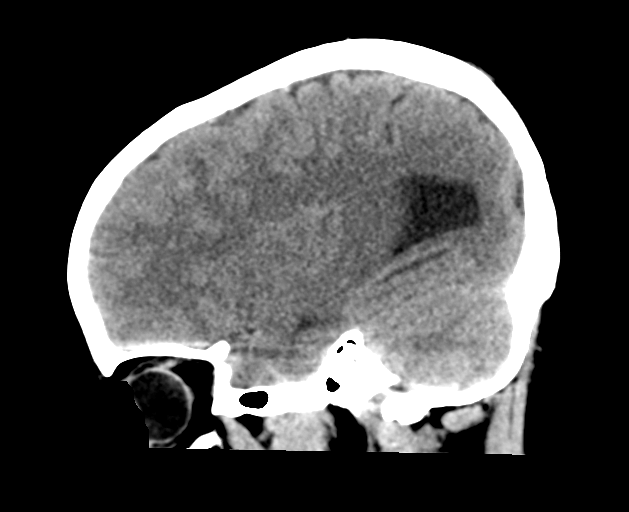
[im 34/67  brain]
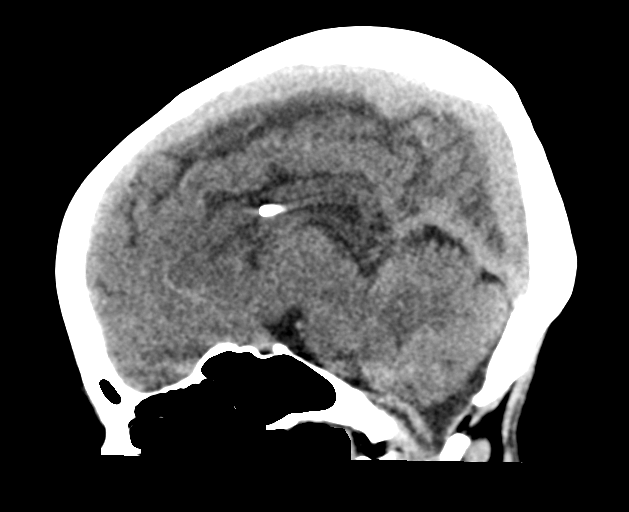
[im 45/67  brain]
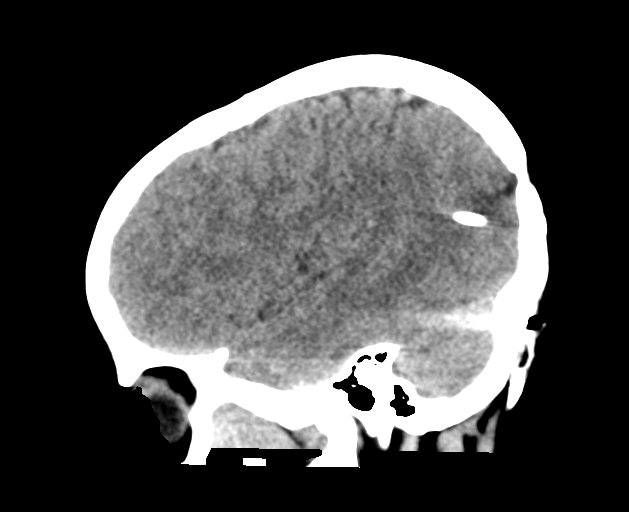

[17 of 47 positions shown; findings below may reference images not displayed]

FINDINGS: Brain: No acute territorial infarction or intracranial hemorrhage is
visualized. 7 mm soft tissue density at the suprasellar region. Left
posterior shunt catheter with tip terminating in the left lateral
ventricle. Stable ventricle size with asymmetric enlargement of the
posterior horns. No midline shift.

Vascular: No hyperdense vessels.  No unexpected calcification

Skull: Left posterior parietal burr hole

Sinuses/Orbits: No acute finding.

Other: None
IMPRESSION: 1. No definite CT evidence for acute intracranial abnormality.
2. 7 mm soft tissue density at the suprasellar region without
change, this may be pituitary in origin. See CT angiography
08/05/2018.
3. Stable ventricle size with left posterior shunt catheter in
place.

## 2021-06-30 ENCOUNTER — Ambulatory Visit (INDEPENDENT_AMBULATORY_CARE_PROVIDER_SITE_OTHER): Payer: No Typology Code available for payment source | Admitting: Clinical

## 2021-06-30 DIAGNOSIS — F419 Anxiety disorder, unspecified: Secondary | ICD-10-CM
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
## 2021-07-13 ENCOUNTER — Other Ambulatory Visit: Payer: Self-pay

## 2021-07-13 ENCOUNTER — Ambulatory Visit (INDEPENDENT_AMBULATORY_CARE_PROVIDER_SITE_OTHER): Payer: No Typology Code available for payment source | Admitting: Clinical

## 2021-07-13 DIAGNOSIS — F411 Generalized anxiety disorder: Secondary | ICD-10-CM

## 2021-07-13 DIAGNOSIS — F331 Major depressive disorder, recurrent, moderate: Secondary | ICD-10-CM
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
## 2021-10-01 ENCOUNTER — Ambulatory Visit (INDEPENDENT_AMBULATORY_CARE_PROVIDER_SITE_OTHER): Payer: No Typology Code available for payment source | Admitting: Clinical

## 2021-10-01 DIAGNOSIS — F331 Major depressive disorder, recurrent, moderate: Secondary | ICD-10-CM | POA: Diagnosis not present

## 2021-10-01 DIAGNOSIS — F4010 Social phobia, unspecified: Secondary | ICD-10-CM

## 2021-10-01 DIAGNOSIS — F411 Generalized anxiety disorder: Secondary | ICD-10-CM

## 2021-10-01 DIAGNOSIS — F8082 Social pragmatic communication disorder: Secondary | ICD-10-CM

## 2021-10-01 NOTE — Progress Notes (Signed)
Testing Visit Documentation    Name: Debra Schultz     MRN: [id number]  Date of Birth: 06/29/1995                                        Age: 26 y.o.  Date of Visit 10/01/21    Type of Service Provided Psychological Testing (Feedback session) Type of Contact: in-person  Location: office Those present at Session: Debra Schultz and her mother  Visit Information: Debra Schultz and her parent presented for the results of the evaluation. No substantial new concerns were reported since last visit, though she has noted an increase in checking of the door and indicated that in the last 5 or so years she has felt that some of her skills have declined.  Results of the assessment were reviewed and interpreted for Debra Schultz including information that supported a diagnosis of GAD, social anxiety, MDD, social communication disorder, and provisional other specified AD/HD. She does not meet full diagnostic criteria for ASD. Additional neuropsychological testing to rule out residual concerns from her medical conditions and brain surgeries was discussed. Recommendations were provided. A full written report will be completed and shared with Debra Schultz. Please see the completed report for more detailed information regarding background information, testing results and interpretation.   Plan: Evaluation complete - appropriate referrals and recommendations for next steps made.    Time Spent as part of the current visit:  Additional integration/Report Generation: 07/14/2021, 12:30 pm -12:50pm, 07/27/2021, 3:00pm-3:30pm, 09/14/2021, 9:45 pm - 10:25 pm, 09/19/2021, 4:50 pm - 5:15 pm, 09/25/2021, 11:20 am - 11:45 am, 09/30/2021, 9:45 pm - 10:30 pm (185 minutes)  Time spent in Interactive Feedback Session: 10/01/2021, 3:15 pm - 4:50 pm (95 minutes)   Please see the notes from dates of services 07/13/2021 for additional documentation of times spent and units that are to be billed  Total billing (including from the current session and  prior dates of service listed above) is as follows:  Total spent in Test Administration and Scoring across all testing visits: 310 minutes  Units billed: 96136 = 1 unit  96137 = 9 units  Total time spend in Testing Evaluation Services including, but not limited to, the integrative feedback session and integration/report generation: 435 minutes   Units billed: 96130 = 1 unit 96131 = 5 units       Zara Chess, PhD

## 2021-10-15 NOTE — Progress Notes (Signed)
____________________________________________________________________________________   CONFIDENTIAL PSYCHOLOGICAL ASSESSMENT1The assessment results are confidential.  This report is not to be copied in whole, or in part, nor discussed without the consent of the parent/guardian or the individual (if 18 years or older).  As children grow and mature, after several years some of the assessment results may become less valid, at which time they are best regarded as useful background information. Name: Debra Schultz      MRN: [id number]  Date of Birth: 1995-11-01      Age: 26 years  Dates of Evaluation: 06/30/2021, 07/13/2021, 10/01/2021  Date of Report:  10/11/2021 Psychologist:  Zara Chess, PhD     Psychology License # [id number], Health Services Provider Certification: HSP-P  Reason for Evaluation Debra Schultz was seen for an evaluation at [facility] due to concerns about her attention and focus, anxiety, social skills, and some repetitive behaviors. Debra Schultz reported that she has worked with mental health professionals since she was 26 years old, but nothing seems to have worked for her. She has always felt that she was a bit different from others and has shown some repetitive behaviors. She was hoping that further evaluation would help to clarify her diagnostic profile and provide information about potential supports.  Relevant Background Information The following background information was obtained from an interview completed with Debra Schultz's mother Leafy Kindle), interviews with Debra Schultz, information gathered from a Developmental History Form, and a review of some available records. The accuracy of the background information is contingent upon the reliability of the responses provided as well as the validity of the information contained in previous records.  Pregnancy and Birth Information Medication during pregnancy: Magnesium IV                                              Exposure to substances or potentially harmful events in utero: No  Complications during pregnancy/delivery: Pregnancy was complicated by high blood pressure, weight gain of more than 30 pounds, preeclampsia, and frequent nausea or vomiting. Her mother was given a magnesium IV for seizure prevention and to try to extend pregnancy. Labor was induced at 32 weeks.  Length of pregnancy: 32 weeks  Delivery method: Induced vaginal        Birth weight: 2 lbs. 15 oz   Complications post-delivery: After birth Debra Schultz developed trouble breathing and was put on oxygen. 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 She was in the NICU for a month and then the regular hospital for another month. While in the NICU she also developed jaundice that was treated with phototherapy.  Developmental Milestones Age of first developmental/behavioral concern: Debra Schultz has "always felt different". For example, for as long as she can remember she has always rocked back and forth. Socially, she seemed to have an easier time in elementary school than she did later in childhood because others did not notice her differences as much. By middle school she struggled to connect with others and became very reserved. She started becoming more depressed and isolated in the 6th grade and was frequently teased for being quiet.   According to her mother, in infancy Debra Schultz was difficult to feed, was not very sociable, and she showed motor skill delays. During her first year of life Debra Schultz was on 7 different medications to try to drain her hydrocephalus. However, these medications did not remedy the problem and at the  age of 26 Debra Schultz had a shunt placed. Afterwards she had in home therapy to help her develop her motor skills. She was about [age] years old before she could sit up independently or crawl. She started walking around the age of [age]. Despite these early difficulties, her mother described Debra Schultz as  very open and outgoing, and she had a number of friends. Bennett's mother reported that Debra Schultz's behavior significantly changed around the 5th or 6th grade, which is also around the time that she had her shunt replaced. More specifically, around the age of 39 Debra Schultz started to shut down and wanted to be secluded and by herself more frequently, and her overall social skills and communication appeared diminished at this time. For example, during this time period, Debra Schultz appeared much less outgoing and stopped talking to people (though she would respond when someone talked to her).   As she has aged her parent has also noticed that it takes Debra Schultz longer than it takes others to process and learn information. Despite seeming to be very "book smart" and having good grades in school, Debra Schultz seems to have difficulty with day-to-day tasks like paying bills and putting away laundry. She also struggles with more complex activities such as driving. For example, although Debra Schultz took driving lessons and her parents have persisted in working with her on driving, Debra Schultz continues to struggle with simultaneously tracking all necessary information when driving (e.g., she could follow a direction such as go to the corner and make a right, but may not pay attention to things like the color of the light or remember to check for traffic before turning). Her mother noted that a few family members in the medical field have approached her to suggest that Debra Schultz shows some signs of autism spectrum disorder. Currently Debra Schultz struggles with communicating and showing emotions. She will respond to people that talk to her but does not approach others, and is bothered by being out in public around people. Her mother noted that Debra Schultz's change in behavior seemed to be somewhat gradual. During the results meeting Debra Schultz described some of her skills as further "deteriorating" over the last 5 or so years.   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 Debra Schultz had a seizure about a year ago. She has a high heart rate. According to her records, Darnisha's medical history also includes an intraventricular hemorrhage as newborn.   Significant accidents, hospitalizations, surgeries, or infections: Debra Schultz had her first shunt surgery in 1998 and with a replacement surgery in 2009, when she was around 63 years old (her mother reported that she required two surgeries at this time because her new shunt broke within a few days, and she had to get another one). She had hernia surgery when she was [age] years old. Debra Schultz also had a seizure last year. Her mother had concerns that the seizure could have been related to the medications that Debra Schultz was taking at the time, so her mother helped Debra Schultz to stop taking her medications.  Allergies:  Debra Schultz is allergic to some antidepressants (she reportedly gets a rash)                                                                                                                       Currently taking any medication: No                                                                                  Current/past eating/feeding concerns:  Debra Schultz reported that she has a tendency overeat. She has never been diagnosed with an eating disorder but is anxious about how much she has been eating recently. She started a 1200 calorie diet and now worries about going over her target calories. Debra Schultz will not eat coconut because she does not like it.                                                                                     Current/past sleeping concerns:  Debra Schultz reported that she has "very bad insomnia" because her brain is always "on 100". She has trouble falling asleep and will wake up in the middle of the night and then cannot go back to sleep.                                                                                    Hygiene concerns/changes: Debra Schultz has always had some hygiene challenges, but in high school she had an easier time managing hygiene activities than she does now. She started focusing less on her hygiene after transitioning into college. Currently, for example, Debra Schultz rarely has the energy to take a shower. There was a period when did not want to cut hair. Her mother noted that Debra Schultz used to take a shower every day but currently takes one every two to three days. Her mother noted that these challenges could possibly be related to Amarah's depression and/or anxiety.    Psychiatric History Current/past exposure to traumas and/or significant stressors (e.g., abuse, witness to violence, fires, significant car accidents):  Debra Schultz reported some exposure to stressors or potential traumas.   Current/past aggressive behavior:  Debra Schultz reported that she can be a little "snarky" at times but is not aggressive.                                                                                 Current/past significant behavioral concerns (e.g., stealing, fire setting, annoying other on purpose or easily annoyed by others): No              Current/past hearing/seeing things not there or expressing unusual beliefs/ideas: No                                                                                         Current/past mood concerns (depressed or unusually elevated moods): Debra Schultz was reportedly diagnosed with depression when she was 38- or 26 years old, with her symptoms of depression reportedly increasing overtime. 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 Recently, Debra Schultz has been feeling disconnected and numb, like the "days are just passing by."  Additional symptoms of depression reported by Debra Schultz include regular feelings of sadness or down moods, feelings of hopelessness, worthlessness, or guilt, decreased energy, loss of interest in previously pleasurable activities, difficulty concentrating and making decisions, irritability, and sleep changes. Debra Schultz described feeling that she is not where she should be for 25 (i.e., she feels that everyone else her age seems to be able to do things that she cannot) and noted that social media can impact the amount of crying that she experiences during the day. Debra Schultz also has some negative thoughts, though denied experiencing suicidal ideation. Over time, Debra Schultz has had periods when she experienced decreased symptoms of depression, but symptoms have always returned. Debra Schultz reported that she may experience an elevated mood for a few hours at a time, but this does not occur very often and does not last for very long.                                           Current/past anxiety concerns (separation, social, general):  Debra Schultz reported that she was previously diagnosed with generalized anxiety disorder. She continues to experience anxiety about a number of different occurrences. Generally, Debra Schultz described herself as in a "constant state of anxiety" and described her worries as excessive. For example, she is always looking over her shoulder when in a big crowd, and because of "how the world is" whenever she is in public she is "always looking for an exit" and planning how she would get of the situation she is in. She indicated that she also has "way too much anxiety to drive." Debra Schultz has difficulty controlling her anxiety once it starts. When  anxious Debra Schultz will feel restless or on edge, has difficulty concentrating, feels that her mind goes blank, is irritable, experiences  muscle tension, and has difficulty sleeping. In addition, her mother has observed that Debra Schultz may begin to panic if her mother is not home at the expected time.   Debra Schultz also experiences several significant social worries/social anxieties, including worries about being judged by others. For example, she and experiences anxiety about making phone calls and must "build herself" up to do something like calling a doctor's office. She will avoid social situations when she can, and when she cannot, she endures them with significant anxiety. For example, there are times when Debra Schultz has a "breakdown" the day before an unavoidable social situation. Debra Schultz indicated that her anxiety seems excessive for the situation that she is in and that her social anxiety impacts her daily living. For example, if she were less anxious Debra Schultz would try to make more friends, go places by herself, and socialize more in general.  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 These symptoms were described by Debra Schultz as "pretty debilitating". For example, in 2018 Debra Schultz described herself as in a state of panic "all day every day" as she would have intrusive thoughts and then felt like had to pray and read the bible. Currently she has learned how to cope with the religious obsessions and compulsions a bit (e.g., she can often let the intrusive thoughts go rather than ruminate on them), but at times still struggles (e.g., she tends to have a difficult time on Saturdays because she knows that she must go to church the next day).   Regarding other obsessions and compulsions, Debra Schultz reported a fear of germs or contamination. For example, she does not want to touch doorknobs and washes her hands frequently due to fears of becoming ill (e.g., Debra Schultz always washes her hands before  and after she eats, after using the restroom, and after brushing her teeth), though she usually only spends 25 to 30 seconds each time she washes her hands. She sanitizes her hands right before leaving a store and then will sanitize them again once she has gotten into the car. However, Debra Schultz noted that hand washing likely does not take her more than an hour a day. She also reported disliking the number 6 because she feels like that is a "scary" and "bad" number. During the results meeting she reported that she had started engaging in some checking behavior, such as checking the front door 5-7 times before going to bed.   Concerns regarding attention/focus/impulsivity: Debra Schultz reported that in childhood she experienced a few attentional challenges, including during early childhood, though these difficulties did not have as much of an impact on her when she was younger (e.g., her grades were always good). For example, her conversations could go "all over the place," she has always been distracted, and in school she was sometimes doodling instead of listening. She noted that her earlier school success may have been related to being allowed more time for distraction in elementary through high school. Since starting college, however, Debra Schultz has felt that she cannot focus. For example, Debra Schultz listens to music when studying because she struggles to complete work without noise, but then may become interested in the music and listen to that instead of studying. She has a hard time focusing on topics that she is uninterested in. Recently she has  also felt that she is more forgetful. For example, currently she may begin talking about something but then forget the point that she was trying to make. She indicated that she likely would have gotten through school more quickly if she were able to study more efficiently and retain information and identified her attention challenges as one of the factors that led to her  taking an extended time to get through school. Attention difficulties also impact her ability to complete tasks at home. With peers, Debra Schultz may look around rather than focusing on the person talking, which has sometimes resulted in others thinking that she is uninterested in what they are talking about. Of note, Debra Schultz indicated that she shows increased fidgeting behavior as her anxiety increases.  Current/past social concerns and/or restricted or repetitive behaviors: Debra Schultz described some history of repetitive behaviors including rocking and rubbing her hair across her mouth and hand. When she was younger, she would flex her legs hard when she was really excited about something. Socially, Debra Schultz reported that she does not have friends currently, does not really communicate with friends, and struggles to keep up with friendships. This was not necessarily true for Debra Schultz when she was younger, however, as she described being the "center of attention" in elementary school.    Debra Schultz stated questioning ASD after seeing videos of adult women on the spectrum on TikTok (she described feeling that "everything" she was seeing in those videos applied to her). Some aspects of the videos that stood out to her included "growing up a loner", being quiet, rocking, being emotional, and having little obsessions (e.g., she was "obsessed" with Queen Slough previously and was told that she talked about him "way too much" and talked about the same things over and over). She also feels that the main character from the show the Good Doctor (the character reportedly has autism) resonated a lot with her.  Current/past suicidal/homicidal ideation: No         Current/past substance use/abuse: Debra Schultz will occasionally have a drink                                      Current/past legal involvement or issues: No  Past Interventions Current/past services/interventions:  Debra Schultz has participated with mental health  therapy in the past. She had OT in early childhood.   History of Psych Hospitalization: No    Work, School and Assessment History      Current school attendance:  Debra Schultz is applying for dental assistance programs. She has been working towards an associates degree for 6 years.   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   Ever repeated a grade: No  Current/past IEP or 504 Plan or any formal or informal accommodations/support in school or out of school:  No                                                                              Records  of prior testing: No                                                 Work history/current work:  Debra Schultz has had jobs in the past but has not worked in the last three years.  Income/Employment/Disability: No income   Family and Social History  Language(s) spoken in the home/primary language:  English    With whom does the individual reside:  Debra Schultz lives with her mother, 2 brothers, and her mother's partner    Medical/psychiatric concerns in immediate and/or extended family history: AD/HD, anxiety, depression, cancer                                                             Consultations necessary/requested: As part of the current evaluation information about Debra Schultz was gathered from her mother.   Relationship Status: Not in a relationship   Support Systems: "Mother"     Financial Stress: Debra Schultz feels stressed that she is not able to work.    Recreation/Hobbies: listening to music (e.g., Nolon Lennert), playing Animal Crossing, watching the Good Doctor   Recent stressors: nothing recent    Strengths: Debra Schultz described herself as a loyal person; she is never intentionally hurtful and is always there for people. She has a Tourist information centre manager, is a good reader, can memorize  musical things quickly, and tries to be a good friend.   Assessment Procedures:  Parent Interview Interviews with Rector Intelligence Scale - Fifth Edition The [place]eland Adaptive Behavior Scales - Third Edition  Autism Diagnostic Observation Schedule - Second Edition (ADOS-2), Module 4 Social Responsiveness Scale - 2 CNS Vital Signs  Adult ADHD Self-Report Scale (ASRS - v1.1) Symptom Checklist Conner's Adult ADHD Rating Scale (CAARS) - Short Form Generalized Anxiety Disorder-7 (GAD-7) Patient Health Questionnaire (PHQ-9) AD/HD Symptom Checklist  Behavioral Observations:   Of note, because this evaluation was completed during the ongoing COVID-19 pandemic several safety precautions were used including the use of masking (examiner was masked but Debra Schultz was not) and social distancing to the extent possible during in-person visits and making use of virtual/telehealth visits when feasible. Given that these safety precautions may have altered the findings, results should be interpreted with caution.  Debra Schultz presented to the evaluation with her mother. During cognitive testing Geneve's effort seemed high, and her attention seemed appropriate. During the neurocognitive testing Debra Schultz sometimes needed extended time to review task directions. When completing tasks there were times when she made noises after realizing that she made an error but was not able to prevent the error from occurring. As the neurocognitive testing continued, Debra Schultz showed more of these behaviors, suggesting that she may have been making slightly more errors as testing progressed. She seemed appropriately engaged in social communication testing.  Overall, it is believed that the below results are a valid estimate of Nafeesah's current functioning. However, due to the above noted concerns (e.g., safety precautions taken due to COVID-19), it is also possible that scores are an over- or under- estimate of  Idamay's abilities. As such, overall results can be interpreted with some caution.  Assessment Results and Interpretation: Stanford Binet Intelligence Scale -  Fifth Edition The Stanford-Binet Intelligence Scale - Fifth Edition is an individually administered assessment of intelligence and cognitive ability. It includes measures of both verbal and nonverbal ability. The Full-Scale IQ is derived from 10 subtests and is generally considered a standard measure of global intellectual functioning. IQ scores are standard scores, which have an average in the population of 100 and a standard deviation of 15. This means that 68% of the population has scores that fall between 85 and 115. Subtest scores have a mean of 10 and a standard deviation of 3. A percentile rank (PR) is provided for scores to show Madeleine's standing relative to other same-age peers. The scores obtained on the Stanford-Binet reflect Yolunda's true abilities combined with some degree of measurement error. Her true score is more accurately represented by a confidence interval (CI), which is a range of scores within which her true score is likely to fall. It is common for individuals to exhibit score differences across areas of performance. Scores can also be influenced by factors such as motivation, attention, interests, and opportunities for learning. All scores may be slightly higher or lower if Debra Schultz were tested again on a different day. It is therefore important to view these test scores as a snapshot of Kashauna's current level of intellectual functioning.   Brittnie's Full Scale IQ was 92 (CI = 88-96, 30%), which falls in the average. The Verbal IQ score includes subtests that assess verbal reasoning, ability to detect verbal absurdities, vocabulary, verbal quantitative reasoning, verbal visual spatial processing, and verbal working memory. Sharis's Verbal IQ was 93 (CI = 87-99, 32%), which places her in the average range. The Nonverbal  IQ includes subtests such as matrices, procedural knowledge, nonverbal quantitative reasoning, nonverbal visual-spatial reasoning, and nonverbal working memory. Pippa's Nonverbal IQ was within the average range (NVIQ = 92, CI= 86-98, 30%).   The Stanford-Binet also provides information about an individual's abilities in five cognitive domains: Fluid Reasoning, Knowledge, Quantitative Reasoning, Visual-Spatial Processing, and Working Memory. The Fluid Reasoning scale measures an individual's ability to solve verbal and nonverbal problems using inductive or deductive reasoning. Subtests include skills such as classifying objects into groups and detecting and explaining absurdities from pictures or information. The Knowledge factor assesses an individual's fund of information (acquired from home or school). These subtests include measures of an individual's vocabulary and procedural knowledge. The Quantitative Reasoning domain assesses an individual's verbal and nonverbal ability to apply mathematical problem solving. The Visual-Spatial Processing domain measures an individual's ability to see patters and relationships. Subscales on this domain may include recreating a two-dimensional visual pattern with movable pieces. The Working Memory domain assesses an individual's ability to temporarily store and transform or sort information in memory. Overall, Deyna's scores were average, with the exception of her Visual-Spatial Processing domain score, which fell in the low average range.     Standard Score (Confidence Interval) Percentile Descriptive Range  Full Scale IQ 92 (88-96) 30 Average  Nonverbal IQ 92 (86-98) 30 Average  Verbal IQ 93 (87-99) 32 Average  Fluid Reasoning 100 (92-108) 50 Average  Knowledge 94 (86-102) 34 Average  Quantitative Reasoning 94 (86-102) 34 Average  Visual-Spatial Processing 85 (78-94) 16 Low Average  Working Memory 94 (87-103) 34 Average  Nonverbal Subtests Verbal Subtests  Fluid Reasoning 11 9  Knowledge 7 11  Quantitative Reasoning 10 8  Visual-Spatial Processing 8 7  Working Memory 8 10   [place] Adaptive Behavior Scales, Third Edition (Vineland-3) Staphanie's mother completed the Vineland-3 Comprehensive Parent/Caregiver Form. The Vineland-3 is a standardized measure of adaptive behavior--the things that people do to function in their everyday lives. Much of the below information was obtained from the Vineland-3 scoring program. Whereas ability measures focus on what the examinee can do in a testing situation, the Vineland-3 focuses on what they actually do in daily life. Because it is a norm-based instrument, the examinee's adaptive functioning is compared to that of others his or her age. The Vineland-3 Comprehensive Parent/Caregiver Form provides norm-referenced scores at three levels: subdomains, domains, and the overall Adaptive Behavior Composite (ABC). Adaptive behavior subdomains make up the most fine-grained score level. The primary norm-referenced scores for the subdomains are v-scale scores, which have a mean of 15 and standard deviation (SD) of 3. For the adaptive behavior domains and the overall ABC, three kinds of results are provided. Standard scores have a mean of 100 and SD of 15. Confidence intervals reflect the effects of measurement error and provide, for each standard score, a range within which Cordia's true standard score falls with a certain probability or confidence. A percentile rank is the percentage of individuals in Kanani's normative age group who scored the same or lower than Debra Schultz.   The Adaptive Behavior Composite (ABC) provides an overall summary measure of Pascale's adaptive functioning. Her ABC standard score is 65 (CI = 63 - 67, 1%). Her overall ABC score can be interpreted with some caution given the variability of the domain scores included in this overall score. The ABC score is  based on scores for three specific adaptive behavior domains: Communication, Daily Living Skills, and Socialization. The Communication domain measures how well Debra Schultz exchanges information with others. Ethelda's Communication domain standard score is based on her scores on three subdomains: Receptive, Expressive, and Written. The Receptive subdomain assesses attending, understanding, and responding appropriately to information from others. Kearston's Expressive score reflects her use of words and sentences to express herself verbally. The Written subdomain score conveys an individual's use of reading and writing skills. The Daily Living Skills domain assesses Jakaylee's performance of the practical, everyday tasks of living that are appropriate for her age. Novalie's Daily Living Skills domain standard score is derived from her scores on three subdomains: Personal, Domestic, and [employer]. Her Personal subdomain score expresses her level of self-sufficiency in such areas as eating, dressing, washing, hygiene, and health care. Her Domestic score reflects the extent to which Debra Schultz performs household tasks such as cleaning up after herself, chores, and food preparation. The Community subdomain measures an individual's functioning in the world outside the home, including safety, using money, travel, and rights and responsibilities. Brenlee's score for the Socialization domain reflects her functioning in social situations. Levetta's Socialization domain standard score is based on her scores on three subdomains: Interpersonal Relationships, Play and Leisure, and Coping Skills. Interpersonal Relationships assesses how an individual responds and relates to others, including friendships, caring, social appropriateness, and conversation. Shawnika's Play and Leisure score reflects how she engages in play and fun activities with others. Her Coping Skills score conveys how well she demonstrates behavioral and emotional  control in different situations involving others.  Brittney's overall Adaptive Behavior Composite score fell within the low range but can be interpreted with caution given the variability of the  scores included in this domain. Tram's Daily Living Skills and Socialization skills also fell in the low range, while her scores in the area of Communication fell in the moderately low range. She showed personal strengths in the areas of expressive and written communication, and personal weaknesses in community daily living skills, interpersonal relationships, play and leisure socialization skills, and coping skills.   ABC Standard Score (SS) 90% Confidence Interval Percentile Rank Level Compared to Others Her Age  Adaptive Behavior Composite 65 63 - 67 1 Low  Domains      Communication 82 77 - 87 12 Moderately Low  Daily Living Skills 68 65 - 71 2 Low  Socialization 41 38 - 44 <1 Low   Subdomains Raw Score v-Scale Score (vS)  Communication Domain    Receptive 70 11  Expressive 97 14  Written 74 15  Daily Living Skills Domain    Personal 99 10  Domestic 44 11  Community 70 9  Socialization Domain    Interpersonal Relationships 42 5  Play and Leisure 29 6  Coping Skills 40 9   CNS Vital Signs CNS Vital Signs is a computerized neuropsychological/neurocognitive test that assess a broad-spectrum of brain function domain performances under challenge (cognition stress test). Scores help to determine severity of impairment based on an age-matched normative comparison database. The standard scores have a mean of 100 and standard deviation of 15. Percentile Ranks indicates how the individual scored compared to other subjects of the same age. Subtests completed by individuals to create the domain scores include the Verbal and Visual Memory Tests, Finger Tapping, Symbol Digit Coding, the Stroop Test, Shifting Attention Test, Continuous Performance Tests, and sometimes the Four-Part Continuous Performance  Tests. Scores on these various tests are used to create the below domain scores.   Domain Standard Score Percentile Range of Functioning  Neurocognitive Index (NCI): a general assessment of the overall neurocognitive status of the person. 87 19 Low Average  Composite Memory: how well a person can recognize, remember, and retrieve words and geometric figures. 115 84 Above   Verbal Memory: how well a person can recognize, remember, and retrieve words. 122 93 Above  Visual Memory: howe well a person can recognize, remember and retrieve geometric figures. 105 63 Average  Psychomotor Speed: how a person perceives, attends, responds to visual-perceptual information, and performs motor speed and fine motor coordination. 71 3 Low  Reaction Time: how quickly a person can react to both simple and increasingly complex directions. 50 1 Very Low  Complex Attention: a person's ability to track and respond to a variety of stimuli and/or perform mental tasks requiring vigilance quickly and accurately. 110 75 Above  Cognitive Flexibility: how well a person can adapt to rapidly changing and increasingly complex set of directions and/or to manipulate information.  91 27 Average  Processing Speed: how well a person recognizes and processes information 79 8 Low  Executive Function: how well a person recognizes rules, categories, and manages or navigates rapid decision making. 91 27 Average  Working Memory: how well a person can perceive and attend to symbols using short-term memory processes. 102 55 Average  Sustained Attention: how well a person can direct and focus cognitive activity on specific stimuli. Relevance: How well a subject can focus and complete task or activity, sequence action, and focus during complex thought. 105 63 Average  Simple Attention: a person's ability to track and respond to a single defined stimulus over lengthy periods of time while performing vigilance and  response inhibition quickly and  accurately.  108 70 Average  Motor Speed: a person's ability to perform movements to produce and satisfy an intention towards a manual action and goal. 77 6 Low   On the CNS Vital Signs, Lotus's Composite Memory, Verbal Memory, and Complex Attention were above average. Her Psychomotor Speed, Processing Speed, and Motor Speed were all low, and her Reaction Time fell in the very low range. Scores suggest that Debra Schultz has some weaknesses in processing of information and with completing tasks quickly. These difficulties can impact Christne's functioning a variety of ways. Challenges with reaction time, for example, can be related to challenges making decisions, tracking conversations, and engaging in more complex tasks like driving. Slower psychomotor speed can also impact someone's ability to complete tasks that require mental and physical coordination.   Autism Diagnostic Observation Schedule, Second Edition (ADOS-2) The ADOS-2 is a direct observation assessment for autism spectrum disorder. There are five modules in the schedule and the appropriate module is chosen according to the age and expressive, functional language level of the individual being tested. Each module consists of standardized presses designed to promote interaction and provide opportunities for the individual to exhibit typical social behavior. An algorithm is used to sum specific item codes for a total combined score on the Social Affect section and Restricted and Repetitive Behavior section. The combined score has a threshold that indicates if an individual's performance was consistent with a diagnosis of autism spectrum disorder. The ADOS-2 Module 4 was chosen for Abbygael's assessment. On the Module 4, which requires fluent speech and is designed for older adolescents/adults, the original algorithm includes a threshold for the Communication and Social Interaction domains, as well as for the combined Communication and Social  Interaction sections that must be met for a classification of autism spectrum disorder. However, according to a subsequently published article, an updated algorithm was created for the Module 4 of the ADOS. Specifically, per the article: "Clinically, the Module 4 revisions yield scores that provide a more accurate summary of ASD symptoms, with an algorithm that is more closely aligned with DSM-5 criteria than the original algorithm. It also affords good sensitivity and improved specificity compared to the original Module 4 algorithm. Although it is always recommended that the ADOS be used as one source of information in a diagnostic battery, good specificity is particularly important in the assessment of adults, for whom parents are not always available to provide the comprehensive developmental history that is often helpful in making differential diagnoses." ([place], C. (2014). The Autism Diagnostic Observations Schedule, Module 4: Revised Algorithm and Standardized Severity Scores. Journal of Autism and Developmental Disorders; 44(8), 2047780234.). As such, in addition to be being scored on the original algorithm, scores on the revised algorithm were also considered as part of the current evaluation.  As described above, this evaluation was completed during the ongoing COVID-19 pandemic. As such, the ADOS-2 was completed while the examiner was wearing a mask (though Debra Schultz was not), which may have impacted the social interaction. Therefore, the ADOS-2 should be interpreted with some caution. Nonetheless, during the current evaluation, Debra Schultz demonstrated a few challenges or inconsistencies in her social communication skills with minimal restricted and repetitive behaviors and several appropriately developed social communication skills. Jiselle's overall performance during the ADOS-2 was inconsistent with a diagnosis of an autism spectrum disorder on both the original and revised algorithms. Please  see the Diagnostic Criteria for Autism Spectrum Disorder section of this report for more information  regarding Debra Schultz' behaviors during the ADOS-2.  Social Responsiveness Scale, Second Edition (SRS-2) The SRS-2 is a measure that identifies social impairment associated with autism spectrum disorders, quantifies its severity, and differentiates it from that which occurs in other disorders. In additional to an overall score, the SRS-2 includes 5 treatment subscales (social awareness, social cognition, social communication, social motivation, and restricted interests and repetitive behaviors) and two subscales which are aligned with the DSM-5 criteria for autism spectrum (Social Communication and Interaction, and Restricted Interests and Repetitive Behavior). The SRS-2 was completed by Rajanae's Debra Schultz and her mother.   On the self-report and parent-report SRS-2s, Krishika's overall score was in the "severe range".  A score in this range indicates deficiencies in reciprocal social behavior and lead to severe interference with everyday social interactions. Such scores are strongly associated with clinical diagnosis of an autism spectrum disorder. On the self-report SRS-2, subscales were all moderately to significantly elevated. On the SRS-2 completed by Colleena's mother, most of the subscales were moderately to significantly elevated, with the exception of social awareness, which was not elevated.   Adult ADHD Self-Report Scale (ASRS - v1.1) Symptom Checklist The ASRS is an 18-question questionnaire designed to help screen for AD/HD in adults. Of the eighteen questions, six have been found to be most predictive of symptoms consistent with AD/HD, with endorsement of at least 4 of these six items suggesting that the individual has a symptom profile that is high consistent with AD/HD in adults. In these cases, further investigation is warranted. Yarely's scores on the ASRS are considered highly consistent  with AD/HD.   Conners' Adult ADHD Rating Scale (CAARS) -Short Form Debra Schultz completed the Conners' Adult ADHD Rating Scales (CAARS)- Short Form. This measure provides information regarding symptoms of ADHD. Scores are T-scores with a mean of 50 and a standard deviation of 10. T-scores between 45-55 are considered average, while T-scores above 70 are considered very much above average.   Scale T-Score  Inattention/Memory Problems: degree to which the individual learns slowly, has difficulty organizing or completing tasks, and has trouble concentrating 67  Hyperactive/Restlessness: degree of which the person has difficulty working at the same task, feels more restless and is more "on the go" than others 57  Impulsiveness/Emotional Lability: degree to which the individual engages in impulsive acts, his/her mood changes quickly, and/or is more easily angered and irritated by people 43  Problems with Self-Concept: degree to which a person shows poor social relationships, low self-esteem, and/or low self-confidence 75  ADHD Index: level of clinically significant ADHD symptoms 75    Generalized Anxiety Disorder - 7 (GAD-7) The GAD-7 is a self-report questionnaire used to assess symptoms associated with stress and anxiety. Scores range from no/minimal symptoms of anxiety to severe symptoms of anxiety. Charlee's scores on the GAD-7 were consistent with severe symptoms of anxiety.   Patient Health Questionnaire (PHQ-9) The PHQ-9 is a self-report questionnaire used for screening, diagnosing, and monitoring for symptoms of depression. Scores range from minimal to no symptoms of depression to severe symptoms of depression.  Kiera's PHQ-9 scores were consistent with severe symptoms of depression.   Symptom Checklist  Vonnetta's mother completed an AD/HD symptom checklist to rate the frequency that Debra Schultz displayed 18 symptoms of AD/HD (9 inattentive and 9 hyperactive-impulsive) from two time periods;  from ages 51-12 and for the past 6 months. Gillie's mother rated 1 of the 9 inattentive symptoms and none of the 9 hyperactive-impulsive as occurring "often" or "very often" from ages 68-12. Notably,  however, several of the inattentive behaviors were rated as occurring "sometimes" in childhood. She rated 8 inattentive behaviors and 2 hyperactive-impulsive behaviors as occurring "often" or "very often" in the last 6 months.   Diagnostic Criteria for Autism Spectrum Disorder from the DSM-5  The Diagnostic and Statistical Manual of Mental Disorders, Fifth Edition (DSM-5) is the handbook currently used by health care professionals in the [place] and much of the world as a guide to diagnosis. The DSM-5 contains descriptions, symptoms, and other criteria for diagnosis. Below are some of the primary DSM-5 criteria for Autism Spectrum Disorder (ASD). Presence of sufficient evidence is determined according to clinical judgment, which is informed by interviews and assessment with the individual, his or her family, and (when necessary), other people who are familiar with Alissandra's behavior. This section addresses persistent deficits in social communication and social interaction across multiple contexts and is manifested by sub criteria A1 through A3, all of which must be met either currently or by history for a diagnosis to be provided. For each of the below criteria (A1-A3) the Evidence column indicates whether there is evidence that the criteria are met. Possible responses include No, Minimal, Some, and Yes. Criteria is considered met if areas A1-A3 are all marked Yes.     Evidence?   A1: Challenges with social-emotional reciprocity? Some - Yes  Information From Parent  In early childhood, Debra Schultz gave objects for the purpose of sharing, showed objects to others, and shared enjoyment. Debra Schultz also always wanted her parents to be interacting with her. As a young child she responded to her name being called  and liked social games. She was able to recognize when someone was upset and offer them comfort.    As noted above, her mother reported that many of Tyshawna's behaviors changed around the 5th grade. For example, up until the 5th or 6th grade, Debra Schultz was very open, laughed, and talk to others. However, she then shut down and was less likely to engage in some of her prior social behaviors (e.g., she was less likely to notice that someone was upset and offer comfort, she began showing fewer emotions).      Currently Debra Schultz will talk to some family members but will generally only talk to people outside of her family if they approach her. For example, she may only start conversations with family members that live with her, but not with other family members. Further, her conversational partner must work to keep the conversation going, because Debra Schultz struggles with maintaining conversations.     Debra Schultz sometimes takes things too literally or misinterprets a joke and needs someone to explain that she should not take what was said seriously.    Debra Schultz has had some challenges with social judgment in the past but seems to be doing somewhat better with this currently.   From Interview with [place] reported having significant conversational challenges including not knowing what to say during conversations and struggling with small talk. There are times that she will go "on and on" about a topic that her conversational partner is not interested in. She reported that she "never" starts conversations with others and would not go up to someone and just start talking to them. She also is confused and unsure about how to end conversations, so she tends to wait for her conversational partner to end them. She noted that she has always had difficulty with conversations, as well as with relating to others (e.g., she can be uninterested in  others at times).     Debra Schultz reported that she is confused when  unfamiliar people try to joke around with her because she is unsure if she should take what they are saying seriously. Although she knows that some phrases are not to be taken literally (such as sayings like 'raining cats and dogs'), Debra Schultz finds these types of statements confusing. There have been times when Debra Schultz had difficulty understanding someone's true intentions and reported that she could be naive at times. However, she is not swayed by things like peer pressure.   Observations from the ADOS-2 During the [place], Debra Schultz responded to most initial conversational bids offered by the examiner but sometimes had difficulty maintaining conversations. Her social responses could be slightly underdeveloped (e.g., she sometimes talked over the examiner).     Although Debra Schultz was able to appropriately describe her own emotional experiences she tended not to comment on the emotional experiences of others. In addition, although she showed some enjoyment during the ADOS-2 her overall level of shared enjoyment was reduced.    Positively, however, Jovani's social overtures were appropriate and the overall interaction between the examiner and Debra Schultz was comfortable.    A2: Challenges with nonverbal communicative behaviors used for social interaction? Some  Information From Parent In early childhood Debra Schultz tended to be happy most of the time. She displayed a range of facial expressions and continued to show an appropriate emotional range until the 5th grade. After the 5th grade, however, Debra Schultz had much more difficulty showing her emotions to others. Recently, for example, her mother noted that Debra Schultz had to explain to someone that she had difficulty showing her emotions after she failed to show excitement or happiness when it was expected, making it seem as though Debra Schultz did not like something that she really did.    Her mother noted that between the 5th and 6th grade Debra Schultz started to show less eye  contact with people and would only look at others for a short period of time (she would look around when talking to others). Nevertheless, her mother has not had significant concerns about Krissi's eye contact.     In early childhood Debra Schultz used gestures including clapping, waving, and pointing to communicate. Currently Debra Schultz can tell how someone is feeling from their facial expressions alone and understands body language.   From Interview with [place] reported that she does not make as much eye contact as expected and shows a narrower than expected range of facial expressions (others seem to have difficulty telling how Debra Schultz is feeling from her facial expressions alone). For example, she has been repeatedly asked why she is feeling angry even when she was not.     She reported that she understands some nonverbal communication but doesn't always pay attention to this. Debra Schultz reported that there have been times where she has misinterpreted someone's facial expression during a conversation.   Observations from the ADOS-2 During the [place], Debra Schultz used appropriately modulated eye contact to begin, regulate, and terminate social interactions. She used a nice range of both descriptive and emphatic gestures. It was noted however, that although she made and directed some variety of facial expressions, she made slightly fewer than expected.    A3: Challenges with developing, maintaining, and understanding relationships? Some  Information From Parent In early childhood Debra Schultz was interested in peers. After her motor skills improved around the age of [age], Debra Schultz was observed to engage in cooperative and pretend play. Although she has never been one to  initiate with peers that she did not know, she usually responded positively to peers that initiated with her when she was younger and was 'in the mix' with peers until the 5th grade. Around the 5th grade, Debra Schultz stopped really interacting  with other children.     Her mother described that until the 41[address] [place] had friends, as well as Firefighter with peers. She also had a best friend. In the 5th grade, Debra Schultz began to shut down, talked less, and did not want to be in crowds. After these changes peers seemed less interested in spending time with Debra Schultz. Nevertheless, even after the 5th grade she had a few friendships that she maintained. After graduating, however, she stopped spending time with some of these peers and currently has limited social relationships. Her mother attributed Urania's challenges with making and keeping friends to her tendency to be shut down and quiet (e.g., her mother reported that after the 5th or 6th grade Debra Schultz became extremely quiet and would not talk to others).     Debra Schultz can sometimes adjust her behavior to fit different social situations.    From Interview with [place] also reported having an interest in peers when she was younger. In elementary school she had get-togethers with peers, a best friend, and described herself as a "social butterfly". Nevertheless, even if elementary school she tended to wait for someone to approach her to have a conversation.     Bryson Corona began having more substantial social difficulties in middle school, as around that time she started feeling unsure of what to say and do during social interactions. She reported that she wants more social relationships and has always wished that she could "be like everyone else" and talk to others, but she does not know what to say. Debra Schultz reported that she is quiet in most social settings (e.g., even at family gatherings she doesn't talk unless someone talks to her first) and therefore struggles to change her behavior to fit into various social settings.   Observations from the [place] seemed to show appropriate insight in a typical social relationship.      This section addresses restricted,  repetitive patterns of behavior, interests, or activities. To meet this criterion, Debra Schultz must have characteristics in at least two of the four sub criteria either currently or by history, which address repetitive behaviors and speech, rigidity, intense interests, and sensory interests/aversions. For each of the below criteria (B1-B4) the Evidence column indicates whether there is evidence that the criteria are met. Criteria are considered met in this area if 2 out of the 4 areas are marked Yes.      Evidence?   B1: Stereotyped or repetitive motor movements, use of objects, or speech? Some  Information From Parent When she was between the ages of [age] and [age] years old Debra Schultz liked to watch the movement of certain objects and at these times would stretch her legs and arms out in a particular way. Once she started moving more independently her parents observed her to engage in some rocking behavior which she continues to do today. No other repetitive use of speech, objects, or body movements were reported.   From Interview with [place] reported that she used to like looking at the movement of fans. She indicated that she engages in some repetitive body movements like tapping her fingers together, rocking, rubbing her hair on her hand or across her mouth, and rubbing her feet together when trying to sleep. She  likes to play with the sides of book pages and may pick at her scalp. She uses some "catch phrases".   Observations from the [place] was not observed to engage in any repetitive use of speech or show any repetitive body movements during the ADOS-2.    B2: Insistence on sameness, inflexible adherence to routines, or ritualized patterns of verbal or nonverbal behavior? Minimal - Some  Information From Parent Debra Schultz did not seem to have difficulty with transitions when she was younger but did like having things a certain way. In the last few years Debra Schultz has been observed to  dislikes some changes, but this was less noticeable when Debra Schultz was younger. Currently, for example she is used to being able to get up to a quiet house because her family members have left for the day so she can be upset and anxious if they are around in the morning. She does not have difficulty with being interrupted or stopping an activity before it is complete. Her mother does observe her to show some cognitive inflexibility, however.   From Interview with [place] reported disliking spontaneous plans. For example, if her father calls and asks her to go with him somewhere the next day she can become very anxious. She disliked having to change seats in school when she was younger and can show some cognitive inflexibility.    B3: Highly restricted, fixated interests that are abnormal in intensity or focus? Some  Information From Parent Michaelia's mother did not observe Debra Schultz to have any intense or unusual interests.   From Interview with [place] reported that she had an intense interest in [place] when she was younger (at the time "all" that she talked about was [place]), which lasted for a year or two. After receiving feedback that people thought it was "weird" that she talked about him so much, so she "toned it down." She currently has interests in Dentist and [employer]. She's also very interested in mental health and reported that she's "always researching" this topic.    B4: Hyper- or hyporeactivity to sensory input or unusual interest in sensory aspects of the environment? Some  Information From Parent Her mother noted that Debra Schultz regularly touches her own hair and will put it into her mouth. She also dislikes loud sounds (e.g., people arguing, children or other people being loud, crowded places).    From Interview with [place] noted that she can be bothered by some types of lighting. She dislikes the feeling of tags in her clothing. She's  bothered by strong odors and some loud sounds. She dislikes certain food textures such as the texture of shaved coconut, hard lettuce, and certain nuts. She can sometimes be a bit under responsive to pain and reported that she always feels hot. She dislikes being touched by people she does not know.   Observations from the [place] was not observed to show any sensory interests or avoidance behaviors during the ADOS-2.    Taken together, Ceira's presentation regarding the autism spectrum is complex. Although Debra Schultz and her mother reported current social skill challenges and some restricted and/or repetitive behaviors, there is insufficient evidence that Debra Schultz meets full DSM-5 criteria for autism spectrum disorder in either area at this time. Her presentation is further complicated by what Ellie's mother (and to some degree Debra Schultz) describe as a marked decrease in social skills and behavior in the 5th or 6th grade, indicating limited social challenges in earlier childhood (DSM-5 criteria  C). This type of marked decline in social behavior later in childhood is generally not consistent with a typical autism spectrum presentation.   Diagnostic Criteria for Attention Deficit/Hyperactivity Disorder from the DSM-5  Below are some of the DSM-5 criteria for Attention Deficit/Hyperactivity Disorder (ADHD). Presence of sufficient evidence is determined according to clinical judgment, which is informed by interviews and assessment with the individual, his or her family, and other people who are familiar with Tyshia's behavior. This section describes persistent patterns of inattention and/or hyperactivity-impulsivity. To meet criteria for a diagnosis of ADHD the individual must meet criteria in the area of inattention (A1), hyperactivity-impulsivity (A2), or both. For each of the below criteria the Evidence column indicates whether there is evidence that the criteria are met. Possible responses  include No, Minimal, Some, and Yes.     Evidence?   A1. Significant symptoms of inattention? Yes  Information From Parent Currently, it is challenging for Debra Schultz to start an activity and to stick with it. She has difficulty completing tasks and requires alarms and timers to help her organize her day. She seems unable to complete daily tasks quickly. For example, on the day of the evaluation visit Debra Schultz got up between 4:00 and 5:00 am and started getting ready, but was still not completely ready to go by 7:30 am. She sometimes has difficulty following directions. For example, although she can follow a recipe, Debra Schultz often wants her mother's help in case she makes a mistake. Debra Schultz tends to shut down if a task is challenging, and her mother noted that it takes her longer to process and learn new tasks than expected. For example, it took her family a while to help Debra Schultz understand that she needed to stay in the kitchen when she was cooking. Jazzmin's mother observed some clear attentional challenges by the 5th or 6th grade.    From Interview with [place] reported that currently she has difficulty paying attention to tasks especially when she finds the task uninteresting. At these times she tends to divert her attention and may fidget. She has difficulty listening when others talk to her and struggles to follow through with instructions and complete tasks. She can be easily distracted. She also noted that she could be forgetful. For example, Debra Schultz will not recall her mother asking her to complete a task when her mother asks about it later. In addition, she cannot remember both pieces of information if she is told them at the same times. She avoids tasks that she feels require mental effort.    Despite being able to organize tasks in general, Debra Schultz has difficulty cleaning her room because she finds this uninteresting, and it requires a lot of work.    Debra Schultz usually does not make  careless errors, as she tends to double check things to make sure they are "perfect", which she related to having a "little bit of OCD." Debra Schultz usually does not misplace her belongings because she keeps most of her possessions in a specific place.   Information from Questionnaires  Loida's mother did not report significant symptoms of AD/HD when Debra Schultz was between the ages of 5-12, though several inattentive behaviors were identified as "sometimes" occurring during this time period on an early symptom checklist. Significant current symptoms were reported by both Debra Schultz and her mother. Please see above for more information.     A2. Significant symptoms of hyperactivity-impulsivity? Minimal   Information From Parent Lilyona's mother reported that Debra Schultz does not fidget excessively and does not  seem to always be on the go. She does not talk excessively (rather, she may not talk enough). Debra Schultz does not seem especially restless, though Debra Schultz rocks and has done this her entire life.   From Interview with [place] reported that she fidgets more than expected and is restless. Debra Schultz can feel irritated by having to wait in line. Although she is not 'on the go' she often feels that she should be doing something, which may be related to feelings of guilt. She sometimes talks excessively. However, she can stay seated when expected, and can engage in quiet activities. She does not interrupt and does not blurt out answers.      Taken together, there is evidence of current challenges with inattention (A1) that are present in two or more settings (DSM-5 criteria C) and are causing clinically significant impairment (DSM-5 criteria D). Although limited symptoms were reported before the age of 22, there was evidence that a least few inattentive behaviors were present prior to this age (DSM-5 criteria B). However, Joslyne's overall presentation is again complicated by Claryssa's reported behavior  change around the 5th or 6th grade. In addition, it is not entirely clear if symptoms could be better explained by another disorder or condition (DSM-5 criteria E).   Summary and Diagnostic Impressions:  Dewanna was seen for an evaluation due to concerns about her attention and focus, anxiety, social skills, and some repetitive behaviors. Debra Schultz reported that she has worked with mental health professionals since she was 26 years old, but continues to have substantial challenges across a range of areas. She has always felt that she was a bit different from others and has shown some repetitive behaviors. She was hoping that further evaluation would help to clarify her diagnostic profile and provide information about potential supports.  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 After birth, she developed trouble breathing and was put on oxygen. 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 While in the NICU she also developed jaundice that was treated with phototherapy. She had a shunt placed at the age of 1 year and additional shunt surgery at the age of 46 years. She had delayed motor skill development, reportedly not walking independently until she was 18 years old. Her mother described that Debra Schultz exhibited a significant change in her behavior (e.g., decreased communication and social engagement, more difficulty showing her emotions, etc.) around the 5th or 6th grade. In addition, her mother reported that despite Debra Schultz appearing very academically inclined, she has shown significant difficulty with completing day-to-day activities independently as she has aged. 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 Of note, although it is possible the  reported change in Laelynn's behavior is related to Terita's previously identified mental health conditions (e.g., depression or anxiety), given the report of decreases in skills it would likely be helpful to further explore this change. For example, if not already completed previously it may be beneficial to complete further evaluation to consider any potential impacts of her medical conditions or surgeries and to ensure that all medical causes of this change have been ruled out.  During the current evaluation, Debra Schultz completed cognitive testing. Specifically, the Stanford-Binet was used to assess Wyolene's cognitive functioning in a variety of domains. Leina's scores ranged from low average to average, with  most of her scores falling in the average range. Parent report on the [place]-3 Parent/Caregiver Form indicated that Brittney's overall Adaptive Behavior Composite score fell within the low range but can be interpreted with caution given the variability of the score included in this domain. Lavina's Daily Living Skills and Socialization skills also fell in the low range, while her Communication domain score fell in the moderately low range. She showed personal strengths in the areas of expressive and written communication, and personal weaknesses in community daily living skills, interpersonal relationships, play and leisure socialization skills, and coping skills. Taken together, results suggest that Debra Schultz has deficits in adaptive skills and would likely benefit from supports and interventions targeting these skills.   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 During the current evaluation she reported substantial symptoms of depression and scores on the PHQ-9 were consistent with severe symptoms of depression. 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 It is recommended that Debra Schultz continue to work with mental health professionals to further clarify her mood presentation. Debra Schultz also reportedly has a prior diagnosis of generalized anxiety disorder and at the time of the evaluation indicated substantial ongoing anxiety. Scores on the GAD-7 were also consistent with severe symptoms of anxiety. As such, it appears that Mersadie's prior Generalized Anxiety Disorder (F41.1) diagnosis continues to be an adequate fit for her presentation and will be retrained. 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 She has a history of OCD which she reported had improved overtime. Nonetheless, she has some ongoing obsessions and/or compulsions related to germs and contamination and checking. As such, it is recommended that she continue to work with mental health professionals to address any residual or ongoing OCD symptoms. Debra Schultz also reported having a history of some exposure to stressors/potential traumas and PTSD has reportedly been mentioned as a possibility. It would be helpful for Debra Schultz to continue to explore this possibility in the context of ongoing mental health therapy.   When considering an autism spectrum diagnosis, several areas are taken into account, including an individual's social communication behavior, the presence of restricted/repetitive behaviors, parent/caregiver reported developmental history, and current developmental functioning. As part of the current evaluation the ADOS-2 was completed with Debra Schultz. It is important to note that the current evaluation was completed during the ongoing COVID-19 pandemic. Due to safety precautions that were taken, the ADOS-2 should be interpreted with some caution. Nevertheless, Auriah's overall performance during the ADOS-2 was inconsistent with a diagnosis of an autism spectrum disorder on both the original and  revised algorithms, as she demonstrated only a few challenges or inconsistencies in her social communication skills with minimal restricted and repetitive behaviors and a number of appropriately developed social communication skills. Developmental history reported by Debra Schultz and her mother was complicated, however. Specifically, Debra Schultz and her mother report current challenges that could be suggestive of something like autism spectrum disorder and scores on the SRS-2s completed by Debra Schultz and her mother fell in the range that is strongly associated with a diagnosis of autism spectrum disorder. However, as described above, they both also reported that Debra Schultz showed better developed social skills prior to the 5th grade, and her mother described a marked change in Kadesia's behavior starting around the 5th or 6th grade. In addition, although Debra Schultz described having some restricted and repetitive behaviors,  her mother reported observing only a few of these behaviors throughout Marybell's life. Therefore, based on the integration of the results from the current diagnostic assessment, self and parent reported developmental history, and behavioral observations, Debra Schultz does not meet diagnostic criteria for autism spectrum disorder. Nevertheless, there were a few social challenges that have been observed across her lifetime, including always having difficulty starting conversations and a tendency to be too literal. 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 As such, Debra Schultz would likely benefit from supports for the development of her social communication skills. Given the complexity of her presentation, however, her skills in this area should continue to be monitored. Should Debra Schultz be provided with an alternative diagnosis that could explain some of her challenges with social communication skills or if her  social presentation appears different after she has had further opportunities to address her anxiety and mood challenges, this diagnosis can be reconsidered to ensure that it continues to be an appropriate fit for Grayson's presentation.  In addition, concerns about Lorraina's attention and focus were noted and questions about potential Attention Deficit/Hyperactivity Disorder have been raised. On neurocognitive testing, Debra Schultz showed weaknesses in the areas of Psychomotor Speed, Processing Speed, Motor Speed, and Reaction Time. Parent and self-report were consistent with significant current symptom of inattention. Her overall presentation in this area is complicated however, as only a few somewhat mild attention difficulties were noted in childhood before the age of 57, and as with her social communication presentation, it is unclear how her reported change in behavior in the 5th or 6th grade impacted her presentation in this area. 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 Further assessment and beginning treatment for anxiety and depression would help clarify Abby's overall diagnostic presentation and current symptoms of inattention. Once this has occurred, the diagnosis of AD/HD can be revisited to ensure that it continue to be an appropriate fit for her presentation.   Overall, Debra Schultz would benefit from additional supports and interventions targeting her skills in several areas. As such, recommendations for intervention services and ongoing supports were provided. A detailed review of recommendations is listed below.  Recommendations:  The following recommendations are based on  findings from the present evaluation and include a variety of recommendations including some from various scoring programs.  If certain services are already being obtained based on a previous recommendation, please continue with those services:  It is recommended that Debra Schultz share the results of this evaluation with her primary care physician and treating providers. If she has not already done so, it would be helpful for Debra Schultz to further discuss her change in behavior around the 5th or 6th grade to ensure that all medical causes of this change have been ruled out.   Given her history of brain surgery, current challenges, and parent reported shift in behavior around the 5th grade, it is recommended that Debra Schultz and her family consider pursuing a neuropsychological evaluation   Debra Schultz would benefit from individual mental health therapy increasing her emotional and behavioral regulation and decreasing anxiety. This person can also help her to increase her social awareness, understanding of friendships and interpretation of social situations, as well as  help her to increase her coping and problem solving skills. Some potential therapeutic approaches to consider include Dialectical Behavioral Therapy (DBT) and Cognitive Behavioral Therapy (CBT). EMDR may also be helpful to address any stressor or potential trauma exposure. Some provider options include [facility] [phone]), [facility] [phone], including [facility] (extension [phone]) and [facility] (extension [phone])) and [facility] [place] [phone]).  Debra Schultz may also benefit from developing additional strategies for coping with stress or uncomfortable feelings.  Debra Schultz will also likely benefit from explicit instruction on how to problem solve and how to cope with her emotions.  Exposure and response prevention has been shown to be beneficial for treating obsessions and  compulsions.   Debra Schultz may want to consider a psychiatric consultation to determine if medication would potentially be a helpful part of her treatment plan. Some options include [facility] and [place] (including Eino Farber and Sharon Seller [phone]), [facility] ([phone]), and [facility] [phone]).   Debra Schultz may also benefit from an occupational therapy consult to address daily living and sensory concerns.   Debra Schultz may want to consider working with an audiologist to complete an Scientist, research (physical sciences) with Debra Schultz.   Debra Schultz may benefit from increasing her understanding of social relationships and her insight into peer interactions. This may be accomplished through therapy targeted at improving her social skills and understanding of social relationships, and can be implemented in individual therapy, a group setting, or through work with a Copywriter, advertising.   It is recommended that Debra Schultz consider seeking accommodations at the post-secondary level to increase her chances of success. For example, it will be important to gather information about the resources available for students through the [place]. Some of the accommodations that should be examined include but are not limited to: priority enrollment for small rather than large classes, teachers who provide external structure and organization, classes in which grades are based on multiple aspects of student performance (including in class participation), and use of a note taker or access to resources to help with getting notes from class, testing in a quiet environment, and extended time for tasks if needed.   Debra Schultz may also benefit from working with an Geologist, engineering that can help address any areas of weakness and help set her up for success. Recommendations for local resources may be available through her university. In addition, [employer]  (www.[employer].org) maintains a [employer] that includes a number of Radiation protection practitioner.   Adults with learning and/or developmental disabilities may benefit from learning more about work accommodations and about how to talk to employers about accommodations. More information about workplace accommodations and the Americans with Disabilities Act can be found at the [employer] (DeathPrevention.it). They can also be contacted by phone at [phone]. Their website also has a form letter for requesting workplace accommodations, and can be found at SouthExposed.es.cfm as well as a Comptroller of workplace accommodations.  Additional information regarding executive functioning challenges and accommodations can be found at: http://www.washington-warren.com/.cfm     Thank you for the opportunity to be involved in Jalisa's care.  If you have further questions regarding the results of this assessment, please contact me at [phone].    Zara Chess   __________________________________, PhD Zara Chess, PhD Psychology License # [id number], Health Services Provider Certification: HSP-P                 Zara Chess, PhD

## 2021-10-15 NOTE — Progress Notes (Signed)
Addendum to feedback note:  Post Service Work and report completion occurred on 10/11/2021, 11:45 am - 1:20 pm, 3:00pm - 5:00 pm, & 7:45 pm - 8:40 pm  (270 minutes)  Report sent to the front desk to be shared with the family on 10/15/2021    Zara Chess, PhD

## 2022-04-20 DIAGNOSIS — F429 Obsessive-compulsive disorder, unspecified: Secondary | ICD-10-CM | POA: Diagnosis not present

## 2022-04-20 DIAGNOSIS — F32A Depression, unspecified: Secondary | ICD-10-CM | POA: Diagnosis not present

## 2022-04-20 DIAGNOSIS — F840 Autistic disorder: Secondary | ICD-10-CM | POA: Diagnosis not present

## 2022-04-20 DIAGNOSIS — F419 Anxiety disorder, unspecified: Secondary | ICD-10-CM | POA: Diagnosis not present

## 2022-04-20 DIAGNOSIS — F4310 Post-traumatic stress disorder, unspecified: Secondary | ICD-10-CM | POA: Diagnosis not present

## 2022-05-12 ENCOUNTER — Telehealth: Payer: Self-pay

## 2022-05-12 NOTE — Telephone Encounter (Signed)
Mychart msg sent. AS, CMA 

## 2022-05-27 DIAGNOSIS — F900 Attention-deficit hyperactivity disorder, predominantly inattentive type: Secondary | ICD-10-CM | POA: Insufficient documentation

## 2022-05-27 DIAGNOSIS — F840 Autistic disorder: Secondary | ICD-10-CM | POA: Diagnosis not present

## 2024-01-03 ENCOUNTER — Ambulatory Visit: Payer: MEDICAID | Attending: Cardiovascular Disease | Admitting: Cardiovascular Disease

## 2024-01-03 ENCOUNTER — Encounter: Payer: Self-pay | Admitting: Cardiovascular Disease

## 2024-01-03 VITALS — BP 110/79 | HR 134 | Ht 63.0 in | Wt 178.7 lb

## 2024-01-03 DIAGNOSIS — R000 Tachycardia, unspecified: Secondary | ICD-10-CM | POA: Diagnosis not present

## 2024-01-03 DIAGNOSIS — R002 Palpitations: Secondary | ICD-10-CM | POA: Insufficient documentation

## 2024-01-03 DIAGNOSIS — R0600 Dyspnea, unspecified: Secondary | ICD-10-CM

## 2024-01-03 MED ORDER — METOPROLOL SUCCINATE ER 25 MG PO TB24: 25.0000 mg | ORAL_TABLET | Freq: Every day | ORAL | 3 refills | Status: AC

## 2024-01-03 NOTE — Patient Instructions (Signed)
 Medication Instructions:  Metoprolol  Succinate 25 mg daily *If you need a refill on your cardiac medications before your next appointment, please call your pharmacy*  Lab Work: None ordered If you have labs (blood work) drawn today and your tests are completely normal, you will receive your results only by: MyChart Message (if you have MyChart) OR A paper copy in the mail If you have any lab test that is abnormal or we need to change your treatment, we will call you to review the results.  Testing/Procedures: Your physician has requested that you have an echocardiogram. Echocardiography is a painless test that uses sound waves to create images of your heart. It provides your doctor with information about the size and shape of your heart and how well your heart's chambers and valves are working. This procedure takes approximately one hour. There are no restrictions for this procedure. Please do NOT wear cologne, perfume, aftershave, or lotions (deodorant is allowed). Please arrive 15 minutes prior to your appointment time.  Please note: We ask at that you not bring children with you during ultrasound (echo/ vascular) testing. Due to room size and safety concerns, children are not allowed in the ultrasound rooms during exams. Our front office staff cannot provide observation of children in our lobby area while testing is being conducted. An adult accompanying a patient to their appointment will only be allowed in the ultrasound room at the discretion of the ultrasound technician under special circumstances. We apologize for any inconvenience.   Follow-Up: At [facility], you and your health needs are our priority.  As part of our continuing mission to provide you with exceptional heart care, our providers are all part of one team.  This team includes your primary Cardiologist (physician) and Advanced Practice Providers or APPs (Physician Assistants and Nurse Practitioners) who all work  together to provide you with the care you need, when you need it.  Your next appointment:   1 year(s)  Provider:   Dr Francyne  We recommend signing up for the patient portal called MyChart.  Sign up information is provided on this After Visit Summary.  MyChart is used to connect with patients for Virtual Visits (Telemedicine).  Patients are able to view lab/test results, encounter notes, upcoming appointments, etc.  Non-urgent messages can be sent to your provider as well.   To learn more about what you can do with MyChart, go to [email or web address].

## 2024-01-08 ENCOUNTER — Encounter: Payer: Self-pay | Admitting: Cardiovascular Disease

## 2024-01-08 NOTE — Progress Notes (Signed)
 Cardiology Office Note:    Date:  01/08/2024   ID:  Debra Schultz, DOB 12/22/1995, MRN [id number]  PCP:  Pcp, No   [facility] Providers Cardiologist:  None     Referring MD: Debra Schultz., MD   Chief Complaint  Patient presents with   Tachycardia  Debra Schultz is a 28 y.o. female who is being seen today for the evaluation of tachycardia at the request of Debra Schultz., MD.   History of Present Illness:    Debra Schultz is a 28 y.o. 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  Her mother accompanies her today.  She occasionally has palpitations, but she is not usually aware of her rapid heartbeat.  She just feels tired a lot and has had episodes of presyncope.  At rest her heart rate is typically in the 80s and 90s but as soon as she stands up her heart rate is in the 110s-130s and has been as high as 150 bpm.  Today when she first checked and her heart rate was 134, after several minutes of sitting down her heart rate was in the 90s.  She has not had full syncopal events.  Her blood pressure runs on the lower end of normal.  She gets lightheaded if he stands up quickly or she stands up for long periods of time.  More than 5 years ago she was seen by Dr. Dann.  For a while she was on treatment with metoprolol  and this did provide some improvements in her complaints.  She has been off that medication for a long time after the prescription refills ran out.  She becomes short of breath with activity, but not at rest.  She denies chest pain at rest or with activity.  She does not have lower extremity edema, orthopnea, PND and does not have focal neurological complaints.  She has not had any falls.  Guanfacine at bedtime and methylphenidate have helped with her neurological complaints.  In 2020 and echocardiogram showed no  evidence of any structural cardiac abnormalities.  She has had a few ECGs over the last 5 years that have all showed moderate sinus tachycardia.  Past Medical History:  Diagnosis Date   Benign neoplasm of pituitary gland (HCC)    Congenital anomaly of optic disc    Dandy-Walker syndrome variant (HCC)    Depression    Hydrocephalus (HCC)    Hydrocephalus (HCC)    Intraventricular hemorrhage of newborn    Pituitary mass (HCC)     Past Surgical History:  Procedure Laterality Date   VENTRICULOPERITONEAL SHUNT      Current Medications: Current Meds  Medication Sig   guanFACINE (TENEX) 2 MG tablet Take 2 mg by mouth at bedtime.   LO LOESTRIN FE 1 MG-10 MCG / 10 MCG tablet Take 1 tablet by mouth daily.   methylphenidate 36 MG PO CR tablet Take 36 mg by mouth daily.   [DISCONTINUED] hydrOXYzine (ATARAX/VISTARIL) 25 MG tablet Take 25 mg by mouth as needed for anxiety.     Allergies:   Trazodone   Social History   Socioeconomic History   Marital status: Single    Spouse name: Not on file   Number of children: Not on file   Years of education: Not on file   Highest education level: Not on file  Occupational History   Not on  file  Tobacco Use   Smoking status: Never   Smokeless tobacco: Never  Substance and Sexual Activity   Alcohol use: Yes    Comment: rarely    Drug use: Never   Sexual activity: Not on file  Other Topics Concern   Not on file  Social History Narrative   Not on file   Social Drivers of Health   Financial Resource Strain: Low Risk  (03/11/2021)   Received from [facility]   Overall Financial Resource Strain (CARDIA)    Difficulty of Paying Living Expenses: Not hard at all  Food Insecurity: No Food Insecurity (03/11/2021)   Received from [facility]   Hunger Vital Sign    Within the past 12 months, you worried that your food would run out before you got the money to buy more.: Never true    Within the past 12  months, the food you bought just didn't last and you didn't have money to get more.: Never true  Transportation Needs: No Transportation Needs (03/11/2021)   Received from [facility] - Transportation    In the past 12 months, has lack of transportation kept you from medical appointments or from getting medications?: No    Lack of Transportation (Non-Medical): No  Physical Activity: Patient Declined (03/11/2021)   Received from [facility]   Exercise Vital Sign    On average, how many days per week do you engage in moderate to strenuous exercise (like a brisk walk)?: Patient declined    On average, how many minutes do you engage in exercise at this level?: Patient declined  Stress: Stress Concern Present (03/11/2021)   Received from [facility] of Occupational Health - Occupational Stress Questionnaire    Feeling of Stress : Rather much  Social Connections: Not on file     Family History: The patient's family history includes CAD in her maternal aunt, maternal grandfather, and maternal uncle; Diabetes in her maternal grandfather and maternal grandmother; Other in her father.  ROS:   Please see the history of present illness.     All other systems reviewed and are negative.  EKGs/Labs/Other Studies Reviewed:    The following studies were reviewed today:  EKG Interpretation Date/Time:  Tuesday January 03 2024 11:40:31 EDT Ventricular Rate:  134 PR Interval:  144 QRS Duration:  68 QT Interval:  286 QTC Calculation: 427 R Axis:   42  Text Interpretation: Sinus tachycardia Cannot rule out Anterior infarct , age undetermined When compared with ECG of 18-Jun-2020 17:37, No significant change since last tracing Confirmed by Mikail Goostree [phone]) on 01/03/2024 12:18:33 PM    Recent Labs: No results found for requested labs within last 365 days.  Recent Lipid Panel No results found for: CHOL,  TRIG, HDL, CHOLHDL, VLDL, LDLCALC, LDLDIRECT 10/27/2021 total cholesterol 168, HDL 48, LDL 101, triglycerides 97 Potassium 4.2, ALT 10, TSH 4.24  Risk Assessment/Calculations:             Physical Exam:    VS:  BP 110/79 (BP Location: Left Arm, Patient Position: Sitting, Cuff Size: Normal)   Pulse (!) 134   Ht 5' 3 (1.6 m)   Wt 178 lb 11.2 oz (81.1 kg)   SpO2 98%   BMI 31.66 kg/m     Wt Readings from Last 3 Encounters:  01/03/24 178 lb 11.2 oz (81.1 kg)  11/08/18 154 lb 12.8 oz (70.2 kg)  09/14/18 150 lb (68 kg)     GEN: Mildly obese, well nourished, well developed in no acute distress HEENT: Normal NECK: No JVD; No carotid bruits LYMPHATICS: No lymphadenopathy CARDIAC: Resting tachycardia, RRR, no murmurs, rubs, gallops RESPIRATORY:  Clear to auscultation without rales, wheezing or rhonchi  ABDOMEN: Soft, non-tender, non-distended MUSCULOSKELETAL:  No edema; No deformity  SKIN: Warm and dry NEUROLOGIC:  Alert and oriented x 3 PSYCHIATRIC:  Normal affect   ASSESSMENT:    1. Palpitations   2. Dyspnea, unspecified type   3. Sinus tachycardia    PLAN:    In order of problems listed above:  Sinus tachycardia: I think she fully meets criteria for POTS.  I do not think her other medications can be incriminated in her symptoms, since her tachycardia has been present before starting these medicines.  The methylphenidate can cause tachycardia, but it has been beneficial for her.  In fact the guanfacine typically causes bradycardia.  Encouraged her to eat a sodium rich diet and drink lots and lots of fluids.  Also encouraged her to start a program of gradually increasing aerobic physical activity, to reach a target of at least 150 minutes a week.  Important to cool down very gradually when she finishes her exercise.  Pay close attention to prodromal symptoms of syncope and lay down as soon as she notices them.  Will resume her treatment with metoprolol  which seem  to help in the past.   Dyspnea on exertion: This may be related to deconditioning and weight excess.  She is quite anxious that she may have some more serious structural cardiac abnormalities and we will repeat an echocardiogram for reassurance.        Medication Adjustments/Labs and Tests Ordered: Current medicines are reviewed at length with the patient today.  Concerns regarding medicines are outlined above.  Orders Placed This Encounter  Procedures   EKG 12-Lead   ECHOCARDIOGRAM COMPLETE   Meds ordered this encounter  Medications   metoprolol  succinate (TOPROL -XL) 25 MG 24 hr tablet    Sig: Take 1 tablet (25 mg total) by mouth daily.    Dispense:  90 tablet    Refill:  3    Patient Instructions  Medication Instructions:  Metoprolol  Succinate 25 mg daily *If you need a refill on your cardiac medications before your next appointment, please call your pharmacy*  Lab Work: None ordered If you have labs (blood work) drawn today and your tests are completely normal, you will receive your results only by: MyChart Message (if you have MyChart) OR A paper copy in the mail If you have any lab test that is abnormal or we need to change your treatment, we will call you to review the results.  Testing/Procedures: Your physician has requested that you have an echocardiogram. Echocardiography is a painless test that uses sound waves to create images of your heart. It provides your doctor with information about the size and shape of your heart and how well your heart's chambers and valves are working. This procedure takes approximately one hour. There are no restrictions for this procedure. Please do NOT wear cologne, perfume, aftershave, or lotions (deodorant is allowed). Please arrive 15 minutes prior to your appointment time.  Please note: We ask at that you not bring children with you during ultrasound (echo/ vascular) testing. Due to room size and safety concerns, children are not  allowed in the ultrasound rooms during exams. Our front office staff cannot provide observation of children in our  lobby area while testing is being conducted. An adult accompanying a patient to their appointment will only be allowed in the ultrasound room at the discretion of the ultrasound technician under special circumstances. We apologize for any inconvenience.   Follow-Up: At [facility], you and your health needs are our priority.  As part of our continuing mission to provide you with exceptional heart care, our providers are all part of one team.  This team includes your primary Cardiologist (physician) and Advanced Practice Providers or APPs (Physician Assistants and Nurse Practitioners) who all work together to provide you with the care you need, when you need it.  Your next appointment:   1 year(s)  Provider:   Dr Schultz  We recommend signing up for the patient portal called MyChart.  Sign up information is provided on this After Visit Summary.  MyChart is used to connect with patients for Virtual Visits (Telemedicine).  Patients are able to view lab/test results, encounter notes, upcoming appointments, etc.  Non-urgent messages can be sent to your provider as well.   To learn more about what you can do with MyChart, go to [email or web address].      Signed, Debra Francyne, MD  01/08/2024 3:20 PM    [facility]

## 2024-01-31 ENCOUNTER — Ambulatory Visit: Payer: MEDICAID | Admitting: Physician Assistant

## 2024-01-31 ENCOUNTER — Encounter: Payer: Self-pay | Admitting: Physician Assistant

## 2024-01-31 ENCOUNTER — Other Ambulatory Visit: Payer: Self-pay

## 2024-01-31 VITALS — BP 130/83 | HR 107 | Wt 177.1 lb

## 2024-01-31 DIAGNOSIS — Q031 Atresia of foramina of Magendie and Luschka: Secondary | ICD-10-CM | POA: Insufficient documentation

## 2024-01-31 DIAGNOSIS — N943 Premenstrual tension syndrome: Secondary | ICD-10-CM | POA: Diagnosis not present

## 2024-01-31 DIAGNOSIS — Z124 Encounter for screening for malignant neoplasm of cervix: Secondary | ICD-10-CM

## 2024-01-31 MED ORDER — DROSPIRENONE-ETHINYL ESTRADIOL 3-0.02 MG PO TABS: ORAL_TABLET | ORAL | 3 refills | Status: AC

## 2024-01-31 NOTE — Progress Notes (Signed)
 GYNECOLOGY  VISIT   HPI: Debra Schultz is a 28 y.o.  single female [id number] here for symptoms that she believes are caused by PMDD. She reports baseline depression that worsens in the week before and after menses and ceases during. She has accompanying fatigue, insomnia, irritability, anger, and overeating.  Patient reports having tried many antidepressant medications in the past, without luck. She is already taking OCPs, which have not provided relief.  GYNECOLOGIC HISTORY: No LMP recorded. Contraception: OCPs Menopausal hormone therapy:  premenopausal Last mammogram:  neve done due to age Last pap smear: Record shows history LSIL in 2021 without colposcopy and repeat cytology in 2022 with NILM. Patient states had normal testing 2024.         OB History     Gravida  0   Para  0   Term  0   Preterm  0   AB  0   Living  0      SAB  0   IAB  0   Ectopic  0   Multiple  0   Live Births  0              Patient Active Problem List   Diagnosis Date Noted   Dandy-Walker syndrome variant (HCC) 01/31/2024   Attention deficit hyperactivity disorder (ADHD), predominantly inattentive type 05/27/2022   Autistic disorder 05/27/2022   Abnormal cervical Papanicolaou smear 07/22/2020   Anxiety disorder 12/11/2012   Hydrocephalus (HCC) [birth year]95    Past Medical History:  Diagnosis Date   Benign neoplasm of pituitary gland (HCC)    Congenital anomaly of optic disc    Dandy-Walker syndrome variant (HCC)    Depression    Hydrocephalus (HCC)    Hydrocephalus (HCC)    Intraventricular hemorrhage of newborn (HCC)    Pituitary mass     Past Surgical History:  Procedure Laterality Date   VENTRICULOPERITONEAL SHUNT      Current Outpatient Medications  Medication Sig Dispense Refill   guanFACINE (TENEX) 2 MG tablet Take 2 mg by mouth at bedtime.     hydrOXYzine (VISTARIL) 25 MG capsule      ibuprofen  (ADVIL ) 600 MG tablet Take 600 mg by mouth as needed.     LO  LOESTRIN FE 1 MG-10 MCG / 10 MCG tablet Take 1 tablet by mouth daily.     methylphenidate 36 MG PO CR tablet Take 36 mg by mouth daily.     metoprolol  succinate (TOPROL -XL) 25 MG 24 hr tablet Take 1 tablet (25 mg total) by mouth daily. 90 tablet 3   No current facility-administered medications for this visit.     ALLERGIES: Trazodone  Family History  Problem Relation Age of Onset   Other Father        SMOKER   Diabetes Maternal Grandmother    CAD Maternal Grandfather    Diabetes Maternal Grandfather    CAD Maternal Aunt    CAD Maternal Uncle     Social History   Socioeconomic History   Marital status: Single    Spouse name: Not on file   Number of children: Not on file   Years of education: Not on file   Highest education level: Not on file  Occupational History   Not on file  Tobacco Use   Smoking status: Never   Smokeless tobacco: Never  Substance and Sexual Activity   Alcohol use: Yes    Comment: rarely    Drug use: Never   Sexual activity: Not on  file  Other Topics Concern   Not on file  Social History Narrative   Not on file   Social Drivers of Health   Financial Resource Strain: Low Risk  (03/11/2021)   Received from [facility]   Overall Financial Resource Strain (CARDIA)    Difficulty of Paying Living Expenses: Not hard at all  Food Insecurity: No Food Insecurity (01/31/2024)   Hunger Vital Sign    Worried About Running Out of Food in the Last Year: Never true    Ran Out of Food in the Last Year: Never true  Transportation Needs: No Transportation Needs (01/31/2024)   PRAPARE - Administrator, Civil Service (Medical): No    Lack of Transportation (Non-Medical): No  Physical Activity: Patient Declined (03/11/2021)   Received from [facility]   Exercise Vital Sign    On average, how many days per week do you engage in moderate to strenuous exercise (like a brisk walk)?: Patient declined    On average,  how many minutes do you engage in exercise at this level?: Patient declined  Stress: Stress Concern Present (03/11/2021)   Received from [facility] of Occupational Health - Occupational Stress Questionnaire    Feeling of Stress : Rather much  Social Connections: Not on file  Intimate Partner Violence: Not on file    Review of Systems  PHYSICAL EXAMINATION:    BP 130/83   Pulse (!) 107   Wt 177 lb 1.6 oz (80.3 kg)   BMI 31.37 kg/m     General appearance: alert, cooperative and appears stated age Head: Normocephalic, without obvious abnormality, atraumatic Lungs: clear to auscultation bilaterally Abdomen: soft, non-tender, no masses,  no organomegaly Extremities: extremities normal, atraumatic, no cyanosis or edema Skin: Skin color, texture, turgor normal. No rashes or lesions Neurologic: Grossly normal  ASSESSMENT & PLAN   1. PMS (premenstrual syndrome) (Primary) 28 year old female patient presenting with baseline depression that worsens in the weeks preceding and following menses, and ceases within first day of menses. She also reports accompanying fatigue, insomnia, irritability, anger, and overeating during this time. She is currently taking OCPs that have not provided relief. While her symptoms are characteristic of PMS vs. PMDD, they're sometimes occurring in follicular phase, which concerns me for possibly more than one pathological process involved, particularly with her history of depression. She is on no current medication for this, and has tried many previously. Today, I'll send her OCPs specific to treatment for PMDD, with 4-day pill-free intervals instead of 7. I also recommend that she see her psychiatry provider to help manage baseline depression, and keep symptom diary of her changes in mood and associated symptoms throughout the month.   2. Cervical cancer screening Up to date by patient's report after LSIL cytology in 2021 and  NILM in 2022. I've advised her to fill out ROI so that we may have access to records.    An After Visit Summary was printed and given to the patient.  Helaine Yackel E Dajon Lazar, [place] 10/14/20251:04 PM

## 2024-02-06 ENCOUNTER — Ambulatory Visit: Payer: Self-pay | Admitting: Cardiovascular Disease

## 2024-02-06 ENCOUNTER — Ambulatory Visit (HOSPITAL_COMMUNITY)
Admission: RE | Admit: 2024-02-06 | Discharge: 2024-02-06 | Disposition: A | Discharge status: Home / Self Care | Payer: MEDICAID | Source: Ambulatory Visit | Attending: Cardiology | Admitting: Cardiology

## 2024-02-06 DIAGNOSIS — R0600 Dyspnea, unspecified: Secondary | ICD-10-CM | POA: Insufficient documentation

## 2024-02-06 LAB — ECHOCARDIOGRAM COMPLETE
Area-P 1/2: 5.32 cm2
S' Lateral: 2.7 cm

## 2024-02-29 ENCOUNTER — Telehealth: Payer: Self-pay | Admitting: Cardiovascular Disease

## 2024-02-29 NOTE — Telephone Encounter (Signed)
 Pt c/o medication issue:  1. Name of Medication: metoprolol  succinate (TOPROL -XL) 25 MG 24 hr tablet   2. How are you currently taking this medication (dosage and times per day)? As written   3. Are you having a reaction (difficulty breathing--STAT)? N/A  4. What is your medication issue? Pt would like to know if she's able to get a tattoo while being on this medication.

## 2024-02-29 NOTE — Telephone Encounter (Signed)
Routed to Dr. Croitoru 

## 2024-05-10 NOTE — Progress Notes (Signed)
 Appointment rescheduled due to weather.   Camie Rote, MSN, CNM, RNC-OB Certified Nurse Midwife, [facility] 05/16/2024 11:17 AM

## 2024-05-15 ENCOUNTER — Ambulatory Visit: Payer: MEDICAID | Admitting: Certified Nurse Midwife

## 2024-05-15 DIAGNOSIS — Z3041 Encounter for surveillance of contraceptive pills: Secondary | ICD-10-CM

## 2024-05-15 DIAGNOSIS — F3281 Premenstrual dysphoric disorder: Secondary | ICD-10-CM

## 2024-05-15 DIAGNOSIS — N943 Premenstrual tension syndrome: Secondary | ICD-10-CM

## 2024-05-15 DIAGNOSIS — Z3009 Encounter for other general counseling and advice on contraception: Secondary | ICD-10-CM

## 2024-05-21 ENCOUNTER — Ambulatory Visit: Payer: MEDICAID | Admitting: Obstetrics and Gynecology

## 2024-05-22 ENCOUNTER — Telehealth: Payer: Self-pay | Admitting: Family Medicine
